# Patient Record
Sex: Female | Born: 1935 | Race: Black or African American | Hispanic: No | Smoking: Never smoker
Health system: Southern US, Community
[De-identification: ages and names within clinical notes are randomized; demographics above are authoritative.]

## PROBLEM LIST (undated history)

## (undated) DIAGNOSIS — F0390 Unspecified dementia without behavioral disturbance: Secondary | ICD-10-CM

## (undated) DIAGNOSIS — N19 Unspecified kidney failure: Secondary | ICD-10-CM

---

## 2021-03-12 ENCOUNTER — Emergency Department (HOSPITAL_COMMUNITY): Payer: Medicare Other

## 2021-03-12 ENCOUNTER — Other Ambulatory Visit: Payer: Self-pay

## 2021-03-12 ENCOUNTER — Encounter (HOSPITAL_COMMUNITY): Payer: Self-pay

## 2021-03-12 ENCOUNTER — Inpatient Hospital Stay (HOSPITAL_COMMUNITY)
Admission: EM | Admit: 2021-03-12 | Discharge: 2021-03-19 | DRG: 481 | Disposition: A | Discharge status: Skilled Nursing Facility | Payer: Medicare Other | Attending: Internal Medicine | Admitting: Internal Medicine

## 2021-03-12 DIAGNOSIS — Z7982 Long term (current) use of aspirin: Secondary | ICD-10-CM | POA: Diagnosis not present

## 2021-03-12 DIAGNOSIS — S72401K Unspecified fracture of lower end of right femur, subsequent encounter for closed fracture with nonunion: Secondary | ICD-10-CM | POA: Diagnosis present

## 2021-03-12 DIAGNOSIS — Z79899 Other long term (current) drug therapy: Secondary | ICD-10-CM | POA: Diagnosis not present

## 2021-03-12 DIAGNOSIS — F0390 Unspecified dementia without behavioral disturbance: Secondary | ICD-10-CM | POA: Diagnosis present

## 2021-03-12 DIAGNOSIS — W109XXA Fall (on) (from) unspecified stairs and steps, initial encounter: Secondary | ICD-10-CM | POA: Diagnosis present

## 2021-03-12 DIAGNOSIS — I129 Hypertensive chronic kidney disease with stage 1 through stage 4 chronic kidney disease, or unspecified chronic kidney disease: Secondary | ICD-10-CM | POA: Diagnosis present

## 2021-03-12 DIAGNOSIS — N1831 Chronic kidney disease, stage 3a: Secondary | ICD-10-CM | POA: Diagnosis present

## 2021-03-12 DIAGNOSIS — S72141A Displaced intertrochanteric fracture of right femur, initial encounter for closed fracture: Secondary | ICD-10-CM | POA: Diagnosis not present

## 2021-03-12 DIAGNOSIS — E039 Hypothyroidism, unspecified: Secondary | ICD-10-CM | POA: Diagnosis present

## 2021-03-12 DIAGNOSIS — W19XXXA Unspecified fall, initial encounter: Secondary | ICD-10-CM

## 2021-03-12 DIAGNOSIS — R9431 Abnormal electrocardiogram [ECG] [EKG]: Secondary | ICD-10-CM | POA: Diagnosis not present

## 2021-03-12 DIAGNOSIS — E871 Hypo-osmolality and hyponatremia: Secondary | ICD-10-CM | POA: Diagnosis not present

## 2021-03-12 DIAGNOSIS — S72143A Displaced intertrochanteric fracture of unspecified femur, initial encounter for closed fracture: Secondary | ICD-10-CM

## 2021-03-12 DIAGNOSIS — D62 Acute posthemorrhagic anemia: Secondary | ICD-10-CM | POA: Diagnosis not present

## 2021-03-12 DIAGNOSIS — Z20822 Contact with and (suspected) exposure to covid-19: Secondary | ICD-10-CM | POA: Diagnosis not present

## 2021-03-12 DIAGNOSIS — S72001A Fracture of unspecified part of neck of right femur, initial encounter for closed fracture: Secondary | ICD-10-CM | POA: Diagnosis not present

## 2021-03-12 DIAGNOSIS — Z79890 Hormone replacement therapy (postmenopausal): Secondary | ICD-10-CM

## 2021-03-12 DIAGNOSIS — Y9301 Activity, walking, marching and hiking: Secondary | ICD-10-CM | POA: Diagnosis present

## 2021-03-12 DIAGNOSIS — R339 Retention of urine, unspecified: Secondary | ICD-10-CM | POA: Diagnosis not present

## 2021-03-12 DIAGNOSIS — S7291XA Unspecified fracture of right femur, initial encounter for closed fracture: Secondary | ICD-10-CM | POA: Diagnosis not present

## 2021-03-12 HISTORY — DX: Unspecified kidney failure: N19

## 2021-03-12 HISTORY — DX: Unspecified dementia, unspecified severity, without behavioral disturbance, psychotic disturbance, mood disturbance, and anxiety: F03.90

## 2021-03-12 LAB — COMPREHENSIVE METABOLIC PANEL
ALT: 19 U/L (ref 0–44)
AST: 26 U/L (ref 15–41)
Albumin: 3.2 g/dL — ABNORMAL LOW (ref 3.5–5.0)
Alkaline Phosphatase: 72 U/L (ref 38–126)
Anion gap: 8 (ref 5–15)
BUN: 14 mg/dL (ref 8–23)
CO2: 26 mmol/L (ref 22–32)
Calcium: 8.8 mg/dL — ABNORMAL LOW (ref 8.9–10.3)
Chloride: 90 mmol/L — ABNORMAL LOW (ref 98–111)
Creatinine, Ser: 1.11 mg/dL — ABNORMAL HIGH (ref 0.44–1.00)
GFR, Estimated: 49 mL/min — ABNORMAL LOW (ref >60)
Glucose, Bld: 168 mg/dL — ABNORMAL HIGH (ref 70–99)
Potassium: 4.3 mmol/L (ref 3.5–5.1)
Sodium: 124 mmol/L — ABNORMAL LOW (ref 135–145)
Total Bilirubin: 0.8 mg/dL (ref 0.3–1.2)
Total Protein: 6.4 g/dL — ABNORMAL LOW (ref 6.5–8.1)

## 2021-03-12 LAB — CBC WITH DIFFERENTIAL/PLATELET
Abs Immature Granulocytes: 0.03 10*3/uL (ref 0.00–0.07)
Basophils Absolute: 0 10*3/uL (ref 0.0–0.1)
Basophils Relative: 0 %
Eosinophils Absolute: 0.1 10*3/uL (ref 0.0–0.5)
Eosinophils Relative: 2 %
HCT: 33.5 % — ABNORMAL LOW (ref 36.0–46.0)
Hemoglobin: 10.9 g/dL — ABNORMAL LOW (ref 12.0–15.0)
Immature Granulocytes: 1 %
Lymphocytes Relative: 15 %
Lymphs Abs: 1 10*3/uL (ref 0.7–4.0)
MCH: 30.2 pg (ref 26.0–34.0)
MCHC: 32.5 g/dL (ref 30.0–36.0)
MCV: 92.8 fL (ref 80.0–100.0)
Monocytes Absolute: 0.7 10*3/uL (ref 0.1–1.0)
Monocytes Relative: 10 %
Neutro Abs: 4.7 10*3/uL (ref 1.7–7.7)
Neutrophils Relative %: 72 %
Platelets: 193 10*3/uL (ref 150–400)
RBC: 3.61 MIL/uL — ABNORMAL LOW (ref 3.87–5.11)
RDW: 12.3 % (ref 11.5–15.5)
WBC: 6.5 10*3/uL (ref 4.0–10.5)
nRBC: 0 % (ref 0.0–0.2)

## 2021-03-12 LAB — RESP PANEL BY RT-PCR (FLU A&B, COVID) ARPGX2
Influenza A by PCR: NEGATIVE
Influenza B by PCR: NEGATIVE
SARS Coronavirus 2 by RT PCR: NEGATIVE

## 2021-03-12 LAB — MAGNESIUM: Magnesium: 1.6 mg/dL — ABNORMAL LOW (ref 1.7–2.4)

## 2021-03-12 MED ORDER — DORZOLAMIDE HCL-TIMOLOL MAL 2-0.5 % OP SOLN
1.0000 [drp] | Freq: Two times a day (BID) | OPHTHALMIC | Status: DC
  Administered 2021-03-12 – 2021-03-19 (×12): 1 [drp] via OPHTHALMIC
  Filled 2021-03-12 (×4): qty 10

## 2021-03-12 MED ORDER — ACETAMINOPHEN 325 MG PO TABS
650.0000 mg | ORAL_TABLET | Freq: Four times a day (QID) | ORAL | Status: DC | PRN
  Administered 2021-03-17: 650 mg via ORAL
  Filled 2021-03-12: qty 2

## 2021-03-12 MED ORDER — LEVOTHYROXINE SODIUM 112 MCG PO TABS
112.0000 ug | ORAL_TABLET | Freq: Every day | ORAL | Status: DC
  Administered 2021-03-13 – 2021-03-18 (×4): 112 ug via ORAL
  Filled 2021-03-12 (×7): qty 1

## 2021-03-12 MED ORDER — LEVOTHYROXINE SODIUM 112 MCG PO TABS: 112.0000 ug | ORAL_TABLET | Freq: Every day | ORAL | Status: DC

## 2021-03-12 MED ORDER — POLYETHYLENE GLYCOL 3350 17 G PO PACK: 17.0000 g | PACK | Freq: Every day | ORAL | Status: DC | PRN

## 2021-03-12 MED ORDER — PROMETHAZINE HCL 12.5 MG PO TABS: 12.5000 mg | ORAL_TABLET | Freq: Four times a day (QID) | ORAL | Status: DC | PRN

## 2021-03-12 MED ORDER — DONEPEZIL HCL 5 MG PO TABS
10.0000 mg | ORAL_TABLET | Freq: Every day | ORAL | Status: DC
  Administered 2021-03-12 – 2021-03-17 (×7): 10 mg via ORAL
  Filled 2021-03-12 (×8): qty 2

## 2021-03-12 MED ORDER — SODIUM CHLORIDE 0.9 % IV SOLN: INTRAVENOUS | Status: AC

## 2021-03-12 MED ORDER — LEVETIRACETAM 500 MG PO TABS
500.0000 mg | ORAL_TABLET | Freq: Every day | ORAL | Status: DC
  Administered 2021-03-13 – 2021-03-18 (×6): 500 mg via ORAL
  Filled 2021-03-12 (×7): qty 1

## 2021-03-12 MED ORDER — CARVEDILOL 12.5 MG PO TABS
25.0000 mg | ORAL_TABLET | Freq: Every day | ORAL | Status: DC
  Administered 2021-03-12 – 2021-03-19 (×8): 25 mg via ORAL
  Filled 2021-03-12 (×9): qty 2

## 2021-03-12 MED ORDER — LATANOPROST 0.005 % OP SOLN
1.0000 [drp] | Freq: Every day | OPHTHALMIC | Status: DC
  Administered 2021-03-12 – 2021-03-18 (×6): 1 [drp] via OPHTHALMIC
  Filled 2021-03-12 (×3): qty 2.5

## 2021-03-12 MED ORDER — FENTANYL CITRATE (PF) 100 MCG/2ML IJ SOLN
50.0000 ug | Freq: Once | INTRAMUSCULAR | Status: AC
Start: 2021-03-12 — End: 2021-03-12
  Administered 2021-03-12: 50 ug via INTRAVENOUS
  Filled 2021-03-12: qty 2

## 2021-03-12 MED ORDER — FAMOTIDINE 20 MG PO TABS
20.0000 mg | ORAL_TABLET | Freq: Every day | ORAL | Status: DC
  Administered 2021-03-12 – 2021-03-17 (×6): 20 mg via ORAL
  Filled 2021-03-12 (×7): qty 1

## 2021-03-12 MED ORDER — CEFAZOLIN SODIUM-DEXTROSE 2-4 GM/100ML-% IV SOLN
2.0000 g | INTRAVENOUS | Status: AC
  Filled 2021-03-12: qty 100

## 2021-03-12 MED ORDER — MORPHINE SULFATE (PF) 2 MG/ML IV SOLN
2.0000 mg | INTRAVENOUS | Status: DC | PRN
  Administered 2021-03-14 – 2021-03-19 (×4): 2 mg via INTRAVENOUS
  Filled 2021-03-12 (×5): qty 1

## 2021-03-12 MED ORDER — MEMANTINE HCL 10 MG PO TABS
5.0000 mg | ORAL_TABLET | Freq: Every day | ORAL | Status: DC
  Administered 2021-03-13 – 2021-03-19 (×7): 5 mg via ORAL
  Filled 2021-03-12 (×7): qty 1

## 2021-03-12 MED ORDER — TRANEXAMIC ACID-NACL 1000-0.7 MG/100ML-% IV SOLN
1000.0000 mg | INTRAVENOUS | Status: AC
  Filled 2021-03-12: qty 100

## 2021-03-12 MED ORDER — ACETAMINOPHEN 650 MG RE SUPP: 650.0000 mg | Freq: Four times a day (QID) | RECTAL | Status: DC | PRN

## 2021-03-12 MED ORDER — ESCITALOPRAM OXALATE 10 MG PO TABS: 20.0000 mg | ORAL_TABLET | Freq: Every day | ORAL | Status: DC

## 2021-03-12 MED ORDER — ENALAPRIL MALEATE 5 MG PO TABS
2.5000 mg | ORAL_TABLET | Freq: Every day | ORAL | Status: DC
  Administered 2021-03-13 – 2021-03-18 (×6): 2.5 mg via ORAL
  Filled 2021-03-12: qty 1
  Filled 2021-03-12: qty 0.5
  Filled 2021-03-12 (×3): qty 1
  Filled 2021-03-12 (×3): qty 0.5
  Filled 2021-03-12 (×2): qty 1

## 2021-03-12 NOTE — ED Notes (Signed)
Pt changed x2 staff members. purwick placed at this time

## 2021-03-12 NOTE — ED Notes (Signed)
Pt is confused and gets agitated easily when staff is trying to give care. Pt doesn't understand all requests and why.

## 2021-03-12 NOTE — ED Triage Notes (Signed)
Pt arrived [facility] from pt home. Pt was walking down two steps and fell on her right hip. Pt has dementia and has a sitter that stay with her during the day.

## 2021-03-12 NOTE — ED Notes (Signed)
Pa at bedside during triage 

## 2021-03-12 NOTE — ED Provider Notes (Addendum)
[facility] EMERGENCY DEPARTMENT Provider Note   CSN: [id number] Arrival date & time: 03/12/21  1356     History Chief Complaint  Patient presents with   Hip Pain    right    Latoya Perry is a 87 y.o. female with past medical history significant for CKD, hypertension, dementia who presents for evaluation of fall.  Witnessed fall with family member.  She is normally able to walk with gait belt with assistance.  Fell directly onto her right hip.  Unsure if hit head.  No anticoagulation.  Level 5 caveat-dementia  Patient not able to provide any history  Sister in room states patient is at baseline mentation.  No illnesses prior to this.  HPI     Past Medical History:  Diagnosis Date   Dementia [facility])    Renal failure     Patient Active Problem List   Diagnosis Date Noted   Closed right hip fracture (HCC) 03/12/2021    History reviewed. No pertinent surgical history.   OB History   No obstetric history on file.     No family history on file.     Home Medications Prior to Admission medications   Medication Sig Start Date End Date Taking? Authorizing Provider  aspirin 81 MG EC tablet Take by mouth.   Yes [provider]  atorvastatin (LIPITOR) 20 MG tablet Take by mouth.   Yes [provider]  azelastine (ASTELIN) 0.1 % nasal spray Place into the nose. 08/08/20  Yes [provider]  calcium carbonate (OS-CAL - DOSED IN MG OF ELEMENTAL CALCIUM) 1250 (500 Ca) MG tablet Take by mouth.   Yes [provider]  carvedilol (COREG) 25 MG tablet Take 25 mg by mouth daily.   Yes [provider]  chlorthalidone (HYGROTON) 25 MG tablet Take 1 tablet by mouth every morning.   Yes [provider]  donepezil (ARICEPT) 10 MG tablet Take 1 tablet by mouth at bedtime. 07/26/15  Yes [provider]  dorzolamide-timolol (COSOPT) 22.3-6.8 MG/ML ophthalmic solution Administer 1 drop into both eyes daily 07/24/20  Yes  [provider]  enalapril (VASOTEC) 2.5 MG tablet Take 2.5 mg by mouth daily. 03/04/16  Yes [provider]  escitalopram (LEXAPRO) 20 MG tablet Take by mouth.   Yes [provider]  famotidine (PEPCID) 20 MG tablet Take 1 tablet by mouth at bedtime. 07/02/20  Yes [provider]  ferrous sulfate 325 (65 FE) MG EC tablet Take by mouth.   Yes [provider]  latanoprost (XALATAN) 0.005 % ophthalmic solution Place 1 drop into both eyes at bedtime.   Yes [provider]  levETIRAcetam (KEPPRA) 500 MG tablet Take 500 mg by mouth daily. 10/11/14  Yes [provider]  levothyroxine (SYNTHROID) 112 MCG tablet Take 112 mcg by mouth daily before breakfast.   Yes [provider]  memantine (NAMENDA) 5 MG tablet Take 5 mg by mouth daily. 02/14/16  Yes [provider]    Allergies    Patient has no allergy information on record.  Review of Systems   Review of Systems  Constitutional: Negative.   HENT: Negative.    Respiratory: Negative.    Cardiovascular: Negative.   Gastrointestinal: Negative.   Genitourinary: Negative.   Musculoskeletal:        Right hip pain  Skin: Negative.   Neurological: Negative.   All other systems reviewed and are negative.  Physical Exam Updated Vital Signs BP (!) 165/76   Pulse 62  Temp 98.2 F (36.8 C) (Axillary)   Resp (!) 24   Ht 5\' 9"  (1.753 m)   Wt 67.6 kg   SpO2 100%   BMI 22.00 kg/m   Physical Exam Vitals and nursing note reviewed.  Constitutional:      General: She is not in acute distress.    Appearance: She is well-developed. She is not ill-appearing, toxic-appearing or diaphoretic.  HENT:     Head: Atraumatic.     Nose: Nose normal.     Mouth/Throat:     Mouth: Mucous membranes are moist.  Eyes:     Pupils: Pupils are equal, round, and reactive to light.  Cardiovascular:     Rate and Rhythm: Normal rate.     Pulses: Normal pulses.          Radial pulses  are 2+ on the right side and 2+ on the left side.       Dorsalis pedis pulses are 2+ on the right side and 2+ on the left side.     Heart sounds: Normal heart sounds.  Pulmonary:     Effort: Pulmonary effort is normal. No respiratory distress.     Breath sounds: Normal breath sounds and air entry.     Comments: Clear bilaterally Chest:     Comments: Equal rise and fall to chest wall.  Nontender Abdominal:     General: Bowel sounds are normal. There is no distension.     Palpations: Abdomen is soft.     Tenderness: There is no abdominal tenderness. There is no right CVA tenderness or left CVA tenderness.     Comments: Soft, nontender  Musculoskeletal:        General: Normal range of motion.     Cervical back: Normal range of motion.     Comments: Lifts bilateral arms above head, diffuse tenderness to left lower extremity, mild shortening and external rotation of leg. Wiggles toes without difficulty  Skin:    General: Skin is warm and dry.     Capillary Refill: Capillary refill takes less than 2 seconds.     Comments: No lacerations  Neurological:     General: No focal deficit present.     Mental Status: She is alert. Mental status is at baseline. She is confused.     Cranial Nerves: Cranial nerves are intact.     Sensory: Sensation is intact.     Motor: Motor function is intact.     Coordination: Coordination is intact.     Comments: Intermittently agitated, does not follow most commands. No facial droop. At baseline mentation per sister in room  Psychiatric:        Mood and Affect: Mood normal.    ED Results / Procedures / Treatments   Labs (all labs ordered are listed, but only abnormal results are displayed) Labs Reviewed  CBC WITH DIFFERENTIAL/PLATELET - Abnormal; Notable for the following components:      Result Value   RBC 3.61 (*)    Hemoglobin 10.9 (*)    HCT 33.5 (*)    All other components within normal limits  COMPREHENSIVE METABOLIC PANEL - Abnormal; Notable for  the following components:   Sodium 124 (*)    Chloride 90 (*)    Glucose, Bld 168 (*)    Creatinine, Ser 1.11 (*)    Calcium 8.8 (*)    Total Protein 6.4 (*)    Albumin 3.2 (*)    GFR, Estimated 49 (*)    All other  components within normal limits  RESP PANEL BY RT-PCR (FLU A&B, COVID) [id number]    EKG None  Radiology DG Chest 1 View  Result Date: 03/12/2021 CLINICAL DATA:  Right hip fracture. EXAM: CHEST  1 VIEW COMPARISON:  None. FINDINGS: The heart size and mediastinal contours are within normal limits. Both lungs are clear. The visualized skeletal structures are unremarkable. IMPRESSION: No active disease. Aortic Atherosclerosis (ICD10-I70.0). Electronically Signed   By: Lupita RaiderJames  Green Jr M.D.   On: 03/12/2021 17:15   DG Tibia/Fibula Right  Result Date: 03/12/2021 CLINICAL DATA:  Right leg pain after fall today. EXAM: RIGHT TIBIA AND FIBULA - 2 VIEW COMPARISON:  None. FINDINGS: There is no evidence of fracture or other focal bone lesions. Soft tissues are unremarkable. IMPRESSION: Negative. Electronically Signed   By: Lupita RaiderJames  Green Jr M.D.   On: 03/12/2021 17:14   CT Head Wo Contrast  Result Date: 03/12/2021 CLINICAL DATA:  Head trauma, moderate to severe.  Fall down steps EXAM: CT HEAD WITHOUT CONTRAST TECHNIQUE: Contiguous axial images were obtained from the base of the skull through the vertex without intravenous contrast. COMPARISON:  None. FINDINGS: Brain: There is atrophy and chronic small vessel disease changes. No acute intracranial abnormality. Specifically, no hemorrhage, hydrocephalus, mass lesion, acute infarction, or significant intracranial injury. Vascular: No hyperdense vessel or unexpected calcification. Skull: No acute calvarial abnormality. Sinuses/Orbits: Visualized paranasal sinuses and mastoids clear. Orbital soft tissues unremarkable. Other: None IMPRESSION: Atrophy, chronic microvascular disease. No acute intracranial abnormality. Electronically Signed   By: Charlett NoseKevin   Dover M.D.   On: 03/12/2021 16:10   CT Cervical Spine Wo Contrast  Result Date: 03/12/2021 CLINICAL DATA:  Fall down steps.  Neck trauma. EXAM: CT CERVICAL SPINE WITHOUT CONTRAST TECHNIQUE: Multidetector CT imaging of the cervical spine was performed without intravenous contrast. Multiplanar CT image reconstructions were also generated. COMPARISON:  None. FINDINGS: Alignment: No subluxation. Skull base and vertebrae: No acute fracture. No primary bone lesion or focal pathologic process. Soft tissues and spinal canal: No prevertebral fluid or swelling. No visible canal hematoma. Disc levels: Diffuse degenerative disc disease with anterior spurring. Moderate bilateral diffuse degenerative facet disease. Upper chest: No acute findings Other: None IMPRESSION: Degenerative disc and facet disease. No acute bony abnormality. Electronically Signed   By: Charlett NoseKevin  Dover M.D.   On: 03/12/2021 16:11   DG Femur 1 View Right  Result Date: 03/12/2021 CLINICAL DATA:  Right hip pain after fall today. EXAM: RIGHT FEMUR 1 VIEW COMPARISON:  None. FINDINGS: Moderately comminuted and displaced fracture is seen involving the intertrochanteric region of the proximal right femur. The middle and distal aspects of the femur are unremarkable. IMPRESSION: Moderately comminuted and displaced intertrochanteric fracture of proximal right femur. Electronically Signed   By: Lupita RaiderJames  Green Jr M.D.   On: 03/12/2021 17:13   DG HIPS BILAT WITH PELVIS MIN 5 VIEWS  Result Date: 03/12/2021 CLINICAL DATA:  Fall today, leg and hip pain. EXAM: DG HIP (WITH OR WITHOUT PELVIS) 5+V BILAT COMPARISON:  Femur evaluation of the same date. FINDINGS: Comminuted intratrochanteric fracture of the RIGHT proximal femur with varus angulation and moderate displacement of the lesser trochanter. Femoral head is located within the acetabulum. There is also apex anterior angulation seen on the cross-table lateral. LEFT hip is located without signs of fracture. There are  signs of vascular disease. Bony pelvis without signs of fracture. IMPRESSION: Comminuted, angulated intertrochanteric fracture of the proximal RIGHT femur with varus angulation and moderate displacement of the lesser trochanter. Electronically Signed  By: Donzetta Kohut M.D.   On: 03/12/2021 17:16    Procedures Procedures   Medications Ordered in ED Medications  ceFAZolin (ANCEF) IVPB 2g/100 mL premix (has no administration in time range)  tranexamic acid (CYKLOKAPRON) IVPB 1,000 mg (has no administration in time range)  fentaNYL (SUBLIMAZE) injection 50 mcg (50 mcg Intravenous Given 03/12/21 1520)    ED Course  I have reviewed the triage vital signs and the nursing notes.  Pertinent labs & imaging results that were available during my care of the patient were reviewed by me and considered in my medical decision making (see chart for details).  87 year old here for evaluation of witnessed mechanical fall onto her right hip just PTA.  Patient has dementia at baseline, does not follow most commands and cannot provide history.  Diffuse tenderness to right hip with shortening and external rotation right lower extremity.  No other obvious traumatic injuries, no lacerations, no head injuries.  Will obtain labs, imaging and reassess.  High suspicion for right hip fracture  Labs and imaging personally reviewed and interpreted:  CBC without leukocytosis Metabolic panel with sodium at 993, glucose 168, corrected sodium to 126, baseline 129 per prior Nephrology labs COVID, Flu neg CT head without acute abnormality Ct cervical without acute abnormality DG hips with right comminuted, angulated intertroc fx DG chest without acute abnormality DG right tibfib without acute abnormality Dg femur with hip fx  Patient reassessed. Sister in room. Patient more cooperative at this time. Discussed labs and imaging. Patient not currently followed by Ortho outpatient. Will admit to medicine   Clinical Course  as of 03/12/21 1831  Tue Mar 12, 2021  1739 CONSULT Ortho Dr. Dallas Schimke, NPO after midnight will follow along [BH]    Clinical Course User Index [BH] Dana Debo A, PA-C    CONSULT with Dr. Mariea Clonts with [facility] who will evaluate patient for admission.  The patient appears reasonably stabilized for admission considering the current resources, flow, and capabilities available in the ED at this time, and I doubt any other [facility] requiring further screening and/or treatment in the ED prior to admission.  MDM Rules/Calculators/A&P                            Final Clinical Impression(s) / ED Diagnoses Final diagnoses:  Fall  Fall, initial encounter  Closed fracture of right hip, initial encounter (HCC)  Dementia without behavioral disturbance, unspecified dementia type [facility])    Rx / DC Orders ED Discharge Orders     None        Takari Duncombe A, PA-C 03/12/21 1830    Avier Jech A, PA-C 03/12/21 1831    Blane Ohara, MD 03/14/21 1725

## 2021-03-12 NOTE — ED Notes (Signed)
Pt family verbalized pt needs smashed foods because she cant swallow good.

## 2021-03-12 NOTE — H&P (Addendum)
History and Physical    Latoya DolphinMattie Perry NFA:[id number] DOB: 1933/12/15 DOA: 03/12/2021  PCP: Smith RobertKikel, Stephen, MD   Patient coming from: Home  I have personally briefly [facility]  Chief Complaint: Fall  HPI: Latoya DolphinMattie Perry is a 87 y.o. female with medical history significant for dementia.  Patient was brought to the ED via EMS reports of a fall, patient was walking down 2 steps and fell on her right hip.  History is obtained from chart review, patient has significant dementia limiting history. Patient has a sitter that sits with her during the day.  Patient is normally able to walk with assistance with a gait belt. I talked to patient's sister [name], patient fell because she missed the second step while coming down the stairs.  Patient's dementia is advanced and she is not able to communicate, assistance with all ADLs.   ED Course: Stable Vitals.  Sodium 124.  Head CT unremarkable.  Pelvic x-ray shows comminuted angulated intertrochanteric fracture of the proximal right femur. EDP talked to Dr. Dallas Schimkeairns, will see in the morning n.p.o. midnight.  Review of Systems: Unable to assess due to advanced dementia.  Past Medical History:  Diagnosis Date   Dementia (HCC)    Renal failure     History reviewed. No pertinent surgical history.   has no history on file for tobacco use, alcohol use, and drug use.  Not on File  Unable to ascertain family history due to dementia.  Prior to Admission medications   Medication Sig Start Date End Date Taking? Authorizing Provider  aspirin 81 MG EC tablet Take by mouth.   Yes [provider]  atorvastatin (LIPITOR) 20 MG tablet Take by mouth.   Yes [provider]  azelastine (ASTELIN) 0.1 % nasal spray Place into the nose. 08/08/20  Yes [provider]  calcium carbonate (OS-CAL - DOSED IN MG OF ELEMENTAL CALCIUM) 1250 (500 Ca) MG tablet Take by mouth.   Yes [provider]  carvedilol (COREG) 25 MG tablet Take 25 mg by mouth daily.   Yes [provider]  chlorthalidone (HYGROTON) 25 MG tablet Take 1 tablet by mouth every morning.   Yes [provider]  donepezil (ARICEPT) 10 MG tablet Take 1 tablet by mouth at bedtime. 07/26/15  Yes [provider]  dorzolamide-timolol (COSOPT) 22.3-6.8 MG/ML ophthalmic solution Administer 1 drop into both eyes daily 07/24/20  Yes [provider]  enalapril (VASOTEC) 2.5 MG tablet Take 2.5 mg by mouth daily. 03/04/16  Yes [provider]  escitalopram (LEXAPRO) 20 MG tablet Take by mouth.   Yes [provider]  famotidine (PEPCID) 20 MG tablet Take 1 tablet by mouth at bedtime. 07/02/20  Yes [provider]  ferrous sulfate 325 (65 FE) MG EC tablet Take by mouth.   Yes [provider]  latanoprost (XALATAN) 0.005 % ophthalmic solution Place 1 drop into both eyes at bedtime.   Yes [provider]  levETIRAcetam (KEPPRA) 500 MG tablet Take 500 mg by mouth daily. 10/11/14  Yes [provider]  levothyroxine (SYNTHROID) 112 MCG tablet Take 112 mcg by mouth daily before breakfast.   Yes [provider]  memantine (NAMENDA) 5 MG tablet Take 5 mg by mouth daily. 02/14/16  Yes [provider]    Physical Exam: Exam limited by patient's baseline dementia.  Not following directions.  Vitals:   03/12/21 1425 03/12/21 1430 03/12/21 1500 03/12/21 1700  BP: (!) 151/68  106/88 (!) 169/77  Pulse: 60 64 (!) 59 63  Resp: 18 (!) 22 18 (!) 22  Temp:      TempSrc:      SpO2: 99% 98% 100% 100%  Weight:      Height:        Constitutional: NAD, calm, comfortable Vitals:   03/12/21 1425 03/12/21 1430 03/12/21 1500 03/12/21 1700  BP: (!) 151/68  106/88 (!) 169/77  Pulse: 60 64 (!) 59 63  Resp: 18 (!) 22 18 (!) 22  Temp:      TempSrc:      SpO2: 99% 98% 100% 100%  Weight:      Height:       Eyes:  lids and conjunctivae  normal ENMT: Limited exam.  Mucous membranes are moist. Neck: normal, supple, no masses, no thyromegaly Respiratory: clear to auscultation bilaterally, no wheezing, no crackles. Normal respiratory effort. No accessory muscle use.  Cardiovascular: Regular rate and rhythm, no murmurs / rubs / gallops. No extremity edema. 2+ pedal pulses.  Abdomen: no tenderness, no masses palpated. No hepatosplenomegaly. Bowel sounds positive.  Musculoskeletal: no clubbing / cyanosis. No joint deformity upper and lower extremities. Good ROM, no contractures. Normal muscle tone.  Skin: no rashes, lesions, ulcers. No induration Neurologic: No apparent cranial nerve abnormality, muted movement to right lower extremity otherwise moving other extremities spontaneously. Psychiatric: Limited exam.  Baseline dementia..   Labs on Admission: I have personally reviewed following labs and imaging studies  CBC: Recent Labs  Lab 03/12/21 1441  WBC 6.5  NEUTROABS 4.7  HGB 10.9*  HCT 33.5*  MCV 92.8  PLT 193   Basic Metabolic Panel: Recent Labs  Lab 03/12/21 1441  NA 124*  K 4.3  CL 90*  CO2 26  GLUCOSE 168*  BUN 14  CREATININE 1.11*  CALCIUM 8.8*   Liver Function Tests: Recent Labs  Lab 03/12/21 1441  AST 26  ALT 19  ALKPHOS 72  BILITOT 0.8  PROT 6.4*  ALBUMIN 3.2*   Radiological Exams on Admission: DG Chest 1 View  Result Date: 03/12/2021 CLINICAL DATA:  Right hip fracture. EXAM: CHEST  1 VIEW COMPARISON:  None. FINDINGS: The heart size and mediastinal contours are within normal limits. Both lungs are clear. The visualized skeletal structures are unremarkable. IMPRESSION: No active disease. Aortic Atherosclerosis (ICD10-I70.0). Electronically Signed   By: Lupita Raider M.D.   On: 03/12/2021 17:15   DG Tibia/Fibula Right  Result Date: 03/12/2021 CLINICAL DATA:  Right leg pain after fall today. EXAM: RIGHT TIBIA AND FIBULA - 2 VIEW COMPARISON:  None. FINDINGS: There is no evidence of fracture  or other focal bone lesions. Soft tissues are unremarkable. IMPRESSION: Negative. Electronically Signed   By: Lupita Raider M.D.   On: 03/12/2021 17:14   CT Head Wo Contrast  Result Date: 03/12/2021 CLINICAL DATA:  Head trauma, moderate to severe.  Fall down steps EXAM: CT HEAD WITHOUT CONTRAST TECHNIQUE: Contiguous axial images were obtained from the base of the skull through the vertex without intravenous contrast. COMPARISON:  None. FINDINGS: Brain: There is atrophy and chronic small vessel disease changes. No acute intracranial abnormality. Specifically, no hemorrhage, hydrocephalus, mass lesion, acute infarction, or significant intracranial injury. Vascular: No hyperdense vessel or unexpected calcification. Skull: No acute calvarial abnormality. Sinuses/Orbits: Visualized paranasal sinuses and mastoids clear. Orbital soft tissues unremarkable. Other: None IMPRESSION: Atrophy, chronic microvascular disease. No acute intracranial abnormality. Electronically Signed   By: Charlett Nose M.D.   On: 03/12/2021  16:10   CT Cervical Spine Wo Contrast  Result Date: 03/12/2021 CLINICAL DATA:  Fall down steps.  Neck trauma. EXAM: CT CERVICAL SPINE WITHOUT CONTRAST TECHNIQUE: Multidetector CT imaging of the cervical spine was performed without intravenous contrast. Multiplanar CT image reconstructions were also generated. COMPARISON:  None. FINDINGS: Alignment: No subluxation. Skull base and vertebrae: No acute fracture. No primary bone lesion or focal pathologic process. Soft tissues and spinal canal: No prevertebral fluid or swelling. No visible canal hematoma. Disc levels: Diffuse degenerative disc disease with anterior spurring. Moderate bilateral diffuse degenerative facet disease. Upper chest: No acute findings Other: None IMPRESSION: Degenerative disc and facet disease. No acute bony abnormality. Electronically Signed   By: Charlett Nose M.D.   On: 03/12/2021 16:11   DG Femur 1 View Right  Result Date:  03/12/2021 CLINICAL DATA:  Right hip pain after fall today. EXAM: RIGHT FEMUR 1 VIEW COMPARISON:  None. FINDINGS: Moderately comminuted and displaced fracture is seen involving the intertrochanteric region of the proximal right femur. The middle and distal aspects of the femur are unremarkable. IMPRESSION: Moderately comminuted and displaced intertrochanteric fracture of proximal right femur. Electronically Signed   By: Lupita Raider M.D.   On: 03/12/2021 17:13   DG HIPS BILAT WITH PELVIS MIN 5 VIEWS  Result Date: 03/12/2021 CLINICAL DATA:  Fall today, leg and hip pain. EXAM: DG HIP (WITH OR WITHOUT PELVIS) 5+V BILAT COMPARISON:  Femur evaluation of the same date. FINDINGS: Comminuted intratrochanteric fracture of the RIGHT proximal femur with varus angulation and moderate displacement of the lesser trochanter. Femoral head is located within the acetabulum. There is also apex anterior angulation seen on the cross-table lateral. LEFT hip is located without signs of fracture. There are signs of vascular disease. Bony pelvis without signs of fracture. IMPRESSION: Comminuted, angulated intertrochanteric fracture of the proximal RIGHT femur with varus angulation and moderate displacement of the lesser trochanter. Electronically Signed   By: Donzetta Kohut M.D.   On: 03/12/2021 17:16    EKG: Independently reviewed.  Sinus rhythm, rate 64.  QTc prolonged 508.  No significant ST or T wave abnormalities no prior EKG to compare.  Assessment/Plan Principal Problem:   Closed right hip fracture (HCC) Active Problems:   Dementia (HCC)   Closed right hip fracture 2/2 mechanical fall-transcervical CT unremarkable, pelvic x-ray shows comminuted intratrochanteric fracture of the proximal right femur. - Dr. Dallas Schimke consulted - N. P.o. midnight - IV morphine 2 mg as needed  Hyponatremia-sodium 124, baseline per Care Everywhere over the past year 131.1.  -1 4 1. -Hydrate  Prolonged QTC-508.  No prior EKGs.  Home  medications include escitalopram. -Check Magnesium -Monitor on telemetry  Dementia-advanced.  At baseline needs assistance with ADLs.  Cannot communicate. -Resume donepezil, memantine, Lexapro - mechanical soft diet, pills should be crushed.  HTN -Resume carvedilol, enalapril - Hold HCTZ while hydrating  Hypothyroidism - Resume Synthroid  CKD 3A- cr 1.1,.  Stable.   DVT prophylaxis: SCDS Code Status: Full code, confirmed with sister - [name] Family Communication: talked to sister, Candancy in the phone who is HCPOA.  Disposition Plan: > 2 days Consults called: Ortho Admission status: Inpt, Tele I certify that at the point of admission it is my clinical judgment that the patient will require inpatient hospital care spanning beyond 2 midnights from the point of admission due to high intensity of service, high risk for further deterioration and high frequency of surveillance required.    Onnie Boer MD [facility]  03/12/2021, 9:50 PM

## 2021-03-13 ENCOUNTER — Encounter (HOSPITAL_COMMUNITY): Payer: Self-pay | Admitting: Internal Medicine

## 2021-03-13 ENCOUNTER — Other Ambulatory Visit: Payer: Self-pay

## 2021-03-13 ENCOUNTER — Telehealth: Payer: Self-pay | Admitting: Orthopedic Surgery

## 2021-03-13 DIAGNOSIS — E871 Hypo-osmolality and hyponatremia: Secondary | ICD-10-CM | POA: Diagnosis not present

## 2021-03-13 DIAGNOSIS — W109XXA Fall (on) (from) unspecified stairs and steps, initial encounter: Secondary | ICD-10-CM

## 2021-03-13 DIAGNOSIS — S72001A Fracture of unspecified part of neck of right femur, initial encounter for closed fracture: Secondary | ICD-10-CM | POA: Diagnosis not present

## 2021-03-13 DIAGNOSIS — F0390 Unspecified dementia without behavioral disturbance: Secondary | ICD-10-CM | POA: Diagnosis not present

## 2021-03-13 LAB — BASIC METABOLIC PANEL
Anion gap: 7 (ref 5–15)
Anion gap: 7 (ref 5–15)
BUN: 14 mg/dL (ref 8–23)
BUN: 15 mg/dL (ref 8–23)
CO2: 26 mmol/L (ref 22–32)
CO2: 28 mmol/L (ref 22–32)
Calcium: 8.7 mg/dL — ABNORMAL LOW (ref 8.9–10.3)
Calcium: 8.4 mg/dL — ABNORMAL LOW (ref 8.9–10.3)
Chloride: 92 mmol/L — ABNORMAL LOW (ref 98–111)
Chloride: 88 mmol/L — ABNORMAL LOW (ref 98–111)
Creatinine, Ser: 0.88 mg/dL (ref 0.44–1.00)
Creatinine, Ser: 1.02 mg/dL — ABNORMAL HIGH (ref 0.44–1.00)
GFR, Estimated: >60 mL/min (ref >60)
GFR, Estimated: 54 mL/min — ABNORMAL LOW (ref >60)
Glucose, Bld: 192 mg/dL — ABNORMAL HIGH (ref 70–99)
Glucose, Bld: 231 mg/dL — ABNORMAL HIGH (ref 70–99)
Potassium: 3.9 mmol/L (ref 3.5–5.1)
Potassium: 3.8 mmol/L (ref 3.5–5.1)
Sodium: 125 mmol/L — ABNORMAL LOW (ref 135–145)
Sodium: 123 mmol/L — ABNORMAL LOW (ref 135–145)

## 2021-03-13 LAB — OSMOLALITY: Osmolality: 274 mOsm/kg — ABNORMAL LOW (ref 275–295)

## 2021-03-13 LAB — CBC
HCT: 31 % — ABNORMAL LOW (ref 36.0–46.0)
Hemoglobin: 10.2 g/dL — ABNORMAL LOW (ref 12.0–15.0)
MCH: 30.2 pg (ref 26.0–34.0)
MCHC: 32.9 g/dL (ref 30.0–36.0)
MCV: 91.7 fL (ref 80.0–100.0)
Platelets: 203 10*3/uL (ref 150–400)
RBC: 3.38 MIL/uL — ABNORMAL LOW (ref 3.87–5.11)
RDW: 12.2 % (ref 11.5–15.5)
WBC: 7.3 10*3/uL (ref 4.0–10.5)
nRBC: 0 % (ref 0.0–0.2)

## 2021-03-13 LAB — TSH: TSH: 1.026 u[IU]/mL (ref 0.350–4.500)

## 2021-03-13 LAB — CORTISOL: Cortisol, Plasma: 30.5 ug/dL

## 2021-03-13 NOTE — Progress Notes (Signed)
Attempted again to call sister to get consent on patient.  No answer at this time.  Will attempt to try again.

## 2021-03-13 NOTE — Progress Notes (Signed)
Romeo Apple From [facility] homehealth wanted an update on pt. She requested information on pt when discharge plans are made.  Work phone number: [phone] Cell phone number: [phone]

## 2021-03-13 NOTE — Progress Notes (Signed)
Attempted to call sister to receive Surgery consent, sister did not answer.

## 2021-03-13 NOTE — ED Notes (Signed)
Pt synthroid marked as given however it was held due to NPO for surgery.

## 2021-03-13 NOTE — Progress Notes (Signed)
PROGRESS NOTE  Latoya Perry LHT:[id number] DOB: September 08, 1933 DOA: 03/12/2021 PCP: Smith Robert, MD   LOS: 1 day   Brief narrative: Latoya Perry is a 87 y.o. female with medical history significant for dementia, presented to hospital after sustaining a fall.  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  Patient has advanced dementia and is unable to communicate accurately.  Needs assistance with all ADLs.  In the ED patient was noted to have sodium of 124.  Head CT scan was unremarkable.  X-ray of the pelvis showed comminuted angulated intertrochanteric fracture of the proximal right femur.  Orthopedics was consulted and patient was admitted to hospital.  Assessment/Plan:  Principal Problem:   Closed right hip fracture (HCC) Active Problems:   Dementia (HCC)   Closed fracture of distal end of right femur with nonunion, unspecified fracture morphology, subsequent encounter   Closed right hip fracture 2/2 mechanical fall- Cervical spine CT unremarkable, pelvic x-ray shows comminuted intratrochanteric fracture of the proximal right femur.  Orthopedics on board.  Continue supportive care including pain management.  Patient will need hip surgery but has low sodium levels.  Communicated with orthopedic team.  Hyponatremia-sodium 124, unsure chronicity but Baseline sodium of 131 or more.  Currently getting normal saline hydration.  Was on HCTZ will hold.  Hold Lexapro at this time.  We will check serum osmolality, urine as well as urine sodium cortisol and TSH.  Check BMP in p.m.  Prolonged QTC-508.  No prior EKGs.  Home medications include escitalopram.  Will discontinue Lexapro.  Correct electrolytes.  Telemetry monitor.   Dementia-advanced.  Needing assistance with ADLs.  Continue donepezil, memantine, hold Lexapro for now.Will get speech therapy evaluation.  Dysphagia diet.   Essential HTN Hold HCTZ.  Continue enalapril and Coreg.   Hypothyroidism Continue Synthroid   CKD 3a- cr 1.1,.   Stable.  We will closely monitor.  Check BMP in AM.    DVT prophylaxis: SCDs Start: 03/12/21 2111   Code Status: Full code  Family Communication: Spoke with the patient's family at bedside.  Status is: Inpatient  Remains inpatient appropriate because:Ongoing active pain requiring inpatient pain management, Unsafe d/c plan, IV treatments appropriate due to intensity of illness or inability to take PO, Inpatient level of care appropriate due to severity of illness, and need for surgical intervention, electrolyte imbalance  Dispo: The patient is from: Home              Anticipated d/c is to: SNF              Patient currently is not medically stable to d/c.   Difficult to place patient No   Consultants: Orthopedics  Procedures: None  Anti-infectives:  Preop  Anti-infectives (From admission, onward)    Start     Dose/Rate Route Frequency Ordered Stop   03/13/21 0600  ceFAZolin (ANCEF) IVPB 2g/100 mL premix        2 g 200 mL/hr over 30 Minutes Intravenous On call to O.R. 03/12/21 1808 03/14/21 0559       Subjective: Today, patient was seen and examined at bedside.  Patient's family at bedside.  Denies pain in the hip.  Poor historian.  History of advanced dementia.  Objective: Vitals:   03/13/21 0500 03/13/21 0826  BP: (!) 167/69 (!) 177/74  Pulse:  66  Resp: 17 17  Temp:  98.6 F (37 C)  SpO2: 98% 97%    Intake/Output Summary (Last 24 hours) at 03/13/2021 0844 Last data filed at 03/13/2021 [phone]  Gross per 24 hour  Intake --  Output 300 ml  Net -300 ml   Filed Weights   03/12/21 1420  Weight: 67.6 kg   Body mass index is 22 kg/m.   Physical Exam: GENERAL: Patient is alert awake mildly communicative, disoriented, elderly female, not in obvious distress. HENT: No scleral pallor or icterus. Pupils equally reactive to light. Oral mucosa is moist NECK: is supple, no gross swelling noted. CHEST: Clear to auscultation. No crackles or wheezes.  Diminished  breath sounds bilaterally. CVS: S1 and S2 heard, no murmur. Regular rate and rhythm.  ABDOMEN: Soft, non-tender, bowel sounds are present. EXTREMITIES: No edema.  Right hip externally rotated. CNS: Cranial nerves are intact.  Moves extremities.  Right hip externally rotated SKIN: warm and dry without rashes.  Data Review: I have personally reviewed the following laboratory data and studies,  CBC: Recent Labs  Lab 03/12/21 1441 03/13/21 0531  WBC 6.5 7.3  NEUTROABS 4.7  --   HGB 10.9* 10.2*  HCT 33.5* 31.0*  MCV 92.8 91.7  PLT 193 203   Basic Metabolic Panel: Recent Labs  Lab 03/12/21 1441 03/13/21 0531  NA 124* 125*  K 4.3 3.9  CL 90* 92*  CO2 26 26  GLUCOSE 168* 192*  BUN 14 14  CREATININE 1.11* 0.88  CALCIUM 8.8* 8.7*  MG 1.6*  --    Liver Function Tests: Recent Labs  Lab 03/12/21 1441  AST 26  ALT 19  ALKPHOS 72  BILITOT 0.8  PROT 6.4*  ALBUMIN 3.2*   No results for input(s): LIPASE, AMYLASE in the last 168 hours. No results for input(s): AMMONIA in the last 168 hours. Cardiac Enzymes: No results for input(s): CKTOTAL, CKMB, CKMBINDEX, TROPONINI in the last 168 hours. BNP (last 3 results) No results for input(s): BNP in the last 8760 hours.  ProBNP (last 3 results) No results for input(s): PROBNP in the last 8760 hours.  CBG: No results for input(s): GLUCAP in the last 168 hours. Recent Results (from the past 240 hour(s))  Resp Panel by RT-PCR (Flu A&B, Covid) Nasopharyngeal Swab     Status: None   Collection Time: 03/12/21  2:57 PM   Specimen: Nasopharyngeal Swab; Nasopharyngeal(NP) swabs in vial transport medium  Result Value Ref Range Status   SARS Coronavirus 2 by RT PCR NEGATIVE NEGATIVE Final    Comment: (NOTE) SARS-CoV-2 target nucleic acids are NOT DETECTED.  The SARS-CoV-2 RNA is generally detectable in upper respiratory specimens during the acute phase of infection. The lowest concentration of SARS-CoV-2 viral copies this assay can  detect is 138 copies/mL. A negative result does not preclude SARS-Cov-2 infection and should not be used as the sole basis for treatment or other patient management decisions. A negative result may occur with  improper specimen collection/handling, submission of specimen other than nasopharyngeal swab, presence of viral mutation(s) within the areas targeted by this assay, and inadequate number of viral copies(<138 copies/mL). A negative result must be combined with clinical observations, patient history, and epidemiological information. The expected result is Negative.  Fact Sheet for Patients:  BloggerCourse.com  Fact Sheet for Healthcare Providers:  SeriousBroker.it  This test is no t yet approved or cleared by the [place] FDA and  has been authorized for detection and/or diagnosis of SARS-CoV-2 by FDA under an Emergency Use Authorization (EUA). This EUA will remain  in effect (meaning this test can be used) for the duration of the COVID-19 declaration under Section 564(b)(1) of the Act, 21  U.S.C.section 360bbb-3(b)(1), unless the authorization is terminated  or revoked sooner.       Influenza A by PCR NEGATIVE NEGATIVE Final   Influenza B by PCR NEGATIVE NEGATIVE Final    Comment: (NOTE) The Xpert Xpress SARS-CoV-2/FLU/RSV plus assay is intended as an aid in the diagnosis of influenza from Nasopharyngeal swab specimens and should not be used as a sole basis for treatment. Nasal washings and aspirates are unacceptable for Xpert Xpress SARS-CoV-2/FLU/RSV testing.  Fact Sheet for Patients: BloggerCourse.comhttps://www.fda.gov/media/152166/download  Fact Sheet for Healthcare Providers: SeriousBroker.ithttps://www.fda.gov/media/152162/download  This test is not yet approved or cleared by the [place] FDA and has been authorized for detection and/or diagnosis of SARS-CoV-2 by FDA under an Emergency Use Authorization (EUA). This EUA will remain in  effect (meaning this test can be used) for the duration of the COVID-19 declaration under Section 564(b)(1) of the Act, 21 U.S.C. section 360bbb-3(b)(1), unless the authorization is terminated or revoked.  Performed at [facility], [address] [place], [place] [address]      Studies: DG Chest 1 View  Result Date: 03/12/2021 CLINICAL DATA:  Right hip fracture. EXAM: CHEST  1 VIEW COMPARISON:  None. FINDINGS: The heart size and mediastinal contours are within normal limits. Both lungs are clear. The visualized skeletal structures are unremarkable. IMPRESSION: No active disease. Aortic Atherosclerosis (ICD10-I70.0). Electronically Signed   By: Lupita RaiderJames  Green Jr M.D.   On: 03/12/2021 17:15   DG Tibia/Fibula Right  Result Date: 03/12/2021 CLINICAL DATA:  Right leg pain after fall today. EXAM: RIGHT TIBIA AND FIBULA - 2 VIEW COMPARISON:  None. FINDINGS: There is no evidence of fracture or other focal bone lesions. Soft tissues are unremarkable. IMPRESSION: Negative. Electronically Signed   By: Lupita RaiderJames  Green Jr M.D.   On: 03/12/2021 17:14   CT Head Wo Contrast  Result Date: 03/12/2021 CLINICAL DATA:  Head trauma, moderate to severe.  Fall down steps EXAM: CT HEAD WITHOUT CONTRAST TECHNIQUE: Contiguous axial images were obtained from the base of the skull through the vertex without intravenous contrast. COMPARISON:  None. FINDINGS: Brain: There is atrophy and chronic small vessel disease changes. No acute intracranial abnormality. Specifically, no hemorrhage, hydrocephalus, mass lesion, acute infarction, or significant intracranial injury. Vascular: No hyperdense vessel or unexpected calcification. Skull: No acute calvarial abnormality. Sinuses/Orbits: Visualized paranasal sinuses and mastoids clear. Orbital soft tissues unremarkable. Other: None IMPRESSION: Atrophy, chronic microvascular disease. No acute intracranial abnormality. Electronically Signed   By: Charlett NoseKevin  Dover M.D.   On: 03/12/2021 16:10    CT Cervical Spine Wo Contrast  Result Date: 03/12/2021 CLINICAL DATA:  Fall down steps.  Neck trauma. EXAM: CT CERVICAL SPINE WITHOUT CONTRAST TECHNIQUE: Multidetector CT imaging of the cervical spine was performed without intravenous contrast. Multiplanar CT image reconstructions were also generated. COMPARISON:  None. FINDINGS: Alignment: No subluxation. Skull base and vertebrae: No acute fracture. No primary bone lesion or focal pathologic process. Soft tissues and spinal canal: No prevertebral fluid or swelling. No visible canal hematoma. Disc levels: Diffuse degenerative disc disease with anterior spurring. Moderate bilateral diffuse degenerative facet disease. Upper chest: No acute findings Other: None IMPRESSION: Degenerative disc and facet disease. No acute bony abnormality. Electronically Signed   By: Charlett NoseKevin  Dover M.D.   On: 03/12/2021 16:11   DG Femur 1 View Right  Result Date: 03/12/2021 CLINICAL DATA:  Right hip pain after fall today. EXAM: RIGHT FEMUR 1 VIEW COMPARISON:  None. FINDINGS: Moderately comminuted and displaced fracture is seen involving the intertrochanteric region of  the proximal right femur. The middle and distal aspects of the femur are unremarkable. IMPRESSION: Moderately comminuted and displaced intertrochanteric fracture of proximal right femur. Electronically Signed   By: Lupita Raider M.D.   On: 03/12/2021 17:13   DG HIPS BILAT WITH PELVIS MIN 5 VIEWS  Result Date: 03/12/2021 CLINICAL DATA:  Fall today, leg and hip pain. EXAM: DG HIP (WITH OR WITHOUT PELVIS) 5+V BILAT COMPARISON:  Femur evaluation of the same date. FINDINGS: Comminuted intratrochanteric fracture of the RIGHT proximal femur with varus angulation and moderate displacement of the lesser trochanter. Femoral head is located within the acetabulum. There is also apex anterior angulation seen on the cross-table lateral. LEFT hip is located without signs of fracture. There are signs of vascular disease. Bony  pelvis without signs of fracture. IMPRESSION: Comminuted, angulated intertrochanteric fracture of the proximal RIGHT femur with varus angulation and moderate displacement of the lesser trochanter. Electronically Signed   By: Donzetta Kohut M.D.   On: 03/12/2021 17:16      Joycelyn Das, MD  [facility] 03/13/2021  If 7PM-7AM, please contact night-coverage

## 2021-03-13 NOTE — Progress Notes (Signed)
Pts family at bedside and expressed concern about pt having trouble swallowing and holding food in her mouth. MD notified and pt was made NPO until seen by speech.

## 2021-03-13 NOTE — Consult Note (Signed)
ORTHOPAEDIC CONSULTATION  REQUESTING PHYSICIAN: Pokhrel, Laxman, MD  ASSESSMENT AND PLAN: 87 y.o. female with the following: Right Hip Intertrochanteric femur fracture  This patient requires inpatient admission to the hospitalist, to include preoperative clearance and perioperative medical management  - Weight Bearing Status/Activity: NWB Right lower extremity  - Additional recommended labs/tests: Preop Labs: CBC, BMP, PT/INR, Chest XR, and EKG  -VTE Prophylaxis: Please hold prior to OR; to resume POD#1 at the discretion of the primary team  - Pain control: Recommend PO pain medications PRN; judicious use of narcotics  - Follow-up plan: F/u 10-14 days postop  -Procedures: Plan for OR once patient has been medically optimized  Plan for Right Hip Cephalomedullary nail; NPO since midnight.  Will proceed with surgery on 7/27 as long as she is medically cleared.  Her sodium remains low despite appropriate therapy.       Chief Complaint: Right hip pain  HPI: Latoya Perry is a 87 y.o. female who presented to the ED for evaluation after sustaining a mechanical fall.  Patient is demented and unable to communicate.  Family not available at the bedside.  Most of this information has been gleaned from the chart.  She was walking down stairs yesterday when she missed a step and fell.  As baseline, she is able to walk with assistance.  Not complaining of pain this morning.  No other injuries noted.    Past Medical History:  Diagnosis Date   Dementia ([place])    Renal failure    History reviewed. No pertinent surgical history. Social History   Socioeconomic History   Marital status: Widowed    Spouse name: Not on file   Number of children: Not on file   Years of education: Not on file   Highest education level: Not on file  Occupational History   Not on file  Tobacco Use   Smoking status: Not on file   Smokeless tobacco: Not on file  Substance and Sexual Activity   Alcohol  use: Not on file   Drug use: Not on file   Sexual activity: Not on file  Other Topics Concern   Not on file  Social History Narrative   Not on file   Social Determinants of Health   Financial Resource Strain: Not on file  Food Insecurity: Not on file  Transportation Needs: Not on file  Physical Activity: Not on file  Stress: Not on file  Social Connections: Not on file   No family history on file. Not on File Prior to Admission medications   Medication Sig Start Date End Date Taking? Authorizing Provider  aspirin 81 MG EC tablet Take by mouth.   Yes [provider]  atorvastatin (LIPITOR) 20 MG tablet Take by mouth.   Yes [provider]  azelastine (ASTELIN) 0.1 % nasal spray Place into the nose. 08/08/20  Yes [provider]  calcium carbonate (OS-CAL - DOSED IN MG OF ELEMENTAL CALCIUM) 1250 (500 Ca) MG tablet Take by mouth.   Yes [provider]  carvedilol (COREG) 25 MG tablet Take 25 mg by mouth daily.   Yes [provider]  chlorthalidone (HYGROTON) 25 MG tablet Take 1 tablet by mouth every morning.   Yes [provider]  donepezil (ARICEPT) 10 MG tablet Take 1 tablet by mouth at bedtime. 07/26/15  Yes [provider]  dorzolamide-timolol (COSOPT) 22.3-6.8 MG/ML ophthalmic solution Administer 1 drop into both eyes daily 07/24/20  Yes [provider]  enalapril (VASOTEC) 2.5  MG tablet Take 2.5 mg by mouth daily. 03/04/16  Yes [provider]  escitalopram (LEXAPRO) 20 MG tablet Take by mouth.   Yes [provider]  famotidine (PEPCID) 20 MG tablet Take 1 tablet by mouth at bedtime. 07/02/20  Yes [provider]  ferrous sulfate 325 (65 FE) MG EC tablet Take by mouth.   Yes [provider]  latanoprost (XALATAN) 0.005 % ophthalmic solution Place 1 drop into both eyes at bedtime.   Yes [provider]  levETIRAcetam (KEPPRA) 500 MG tablet Take 500 mg by mouth daily.  10/11/14  Yes [provider]  levothyroxine (SYNTHROID) 112 MCG tablet Take 112 mcg by mouth daily before breakfast.   Yes [provider]  memantine (NAMENDA) 5 MG tablet Take 5 mg by mouth daily. 02/14/16  Yes [provider]   DG Chest 1 View  Result Date: 03/12/2021 CLINICAL DATA:  Right hip fracture. EXAM: CHEST  1 VIEW COMPARISON:  None. FINDINGS: The heart size and mediastinal contours are within normal limits. Both lungs are clear. The visualized skeletal structures are unremarkable. IMPRESSION: No active disease. Aortic Atherosclerosis (ICD10-I70.0). Electronically Signed   By: Marijo Conception M.D.   On: 03/12/2021 17:15   DG Tibia/Fibula Right  Result Date: 03/12/2021 CLINICAL DATA:  Right leg pain after fall today. EXAM: RIGHT TIBIA AND FIBULA - 2 VIEW COMPARISON:  None. FINDINGS: There is no evidence of fracture or other focal bone lesions. Soft tissues are unremarkable. IMPRESSION: Negative. Electronically Signed   By: Marijo Conception M.D.   On: 03/12/2021 17:14   CT Head Wo Contrast  Result Date: 03/12/2021 CLINICAL DATA:  Head trauma, moderate to severe.  Fall down steps EXAM: CT HEAD WITHOUT CONTRAST TECHNIQUE: Contiguous axial images were obtained from the base of the skull through the vertex without intravenous contrast. COMPARISON:  None. FINDINGS: Brain: There is atrophy and chronic small vessel disease changes. No acute intracranial abnormality. Specifically, no hemorrhage, hydrocephalus, mass lesion, acute infarction, or significant intracranial injury. Vascular: No hyperdense vessel or unexpected calcification. Skull: No acute calvarial abnormality. Sinuses/Orbits: Visualized paranasal sinuses and mastoids clear. Orbital soft tissues unremarkable. Other: None IMPRESSION: Atrophy, chronic microvascular disease. No acute intracranial abnormality. Electronically Signed   By: Rolm Baptise M.D.   On: 03/12/2021 16:10   CT Cervical Spine Wo  Contrast  Result Date: 03/12/2021 CLINICAL DATA:  Fall down steps.  Neck trauma. EXAM: CT CERVICAL SPINE WITHOUT CONTRAST TECHNIQUE: Multidetector CT imaging of the cervical spine was performed without intravenous contrast. Multiplanar CT image reconstructions were also generated. COMPARISON:  None. FINDINGS: Alignment: No subluxation. Skull base and vertebrae: No acute fracture. No primary bone lesion or focal pathologic process. Soft tissues and spinal canal: No prevertebral fluid or swelling. No visible canal hematoma. Disc levels: Diffuse degenerative disc disease with anterior spurring. Moderate bilateral diffuse degenerative facet disease. Upper chest: No acute findings Other: None IMPRESSION: Degenerative disc and facet disease. No acute bony abnormality. Electronically Signed   By: Rolm Baptise M.D.   On: 03/12/2021 16:11   DG Femur 1 View Right  Result Date: 03/12/2021 CLINICAL DATA:  Right hip pain after fall today. EXAM: RIGHT FEMUR 1 VIEW COMPARISON:  None. FINDINGS: Moderately comminuted and displaced fracture is seen involving the intertrochanteric region of the proximal right femur. The middle and distal aspects of the femur are unremarkable. IMPRESSION: Moderately comminuted and displaced intertrochanteric fracture of proximal right femur. Electronically Signed   By: Marijo Conception  M.D.   On: 03/12/2021 17:13   DG HIPS BILAT WITH PELVIS MIN 5 VIEWS  Result Date: 03/12/2021 CLINICAL DATA:  Fall today, leg and hip pain. EXAM: DG HIP (WITH OR WITHOUT PELVIS) 5+V BILAT COMPARISON:  Femur evaluation of the same date. FINDINGS: Comminuted intratrochanteric fracture of the RIGHT proximal femur with varus angulation and moderate displacement of the lesser trochanter. Femoral head is located within the acetabulum. There is also apex anterior angulation seen on the cross-table lateral. LEFT hip is located without signs of fracture. There are signs of vascular disease. Bony pelvis without signs of  fracture. IMPRESSION: Comminuted, angulated intertrochanteric fracture of the proximal RIGHT femur with varus angulation and moderate displacement of the lesser trochanter. Electronically Signed   By: Zetta Bills M.D.   On: 03/12/2021 17:16   Family History Reviewed and non-contributory, no pertinent history of problems with bleeding or anesthesia    Review of Systems No SOB No chest pain    OBJECTIVE  Vitals:Patient Vitals for the past 8 hrs:  BP Temp Temp src Pulse Resp SpO2  03/13/21 0826 (!) 177/74 98.6 F (37 C) Oral 66 17 97 %  03/13/21 0500 (!) 167/69 -- -- -- 17 98 %  03/13/21 0400 (!) 171/70 -- -- 66 17 98 %  03/13/21 0130 (!) 151/75 -- -- 64 (!) 22 100 %   General: Alert, no acute distress.  Appears comfortable Cardiovascular: Extremities are warm Respiratory: No cyanosis, no use of accessory musculature Skin: No lesions in the area of chief complaint  Neurologic: Responds to light touch distally  Psychiatric: Patient does not answer questions appropriately  Lymphatic: No swelling obvious and reported other than the area involved in the exam below Extremities  RLE: Extremity held in a fixed position.  ROM deferred due to known fracture.  Responds to light touch on foot.  Dorsiflexes ankle and toes. 2+ DP pulse.  Toes are WWP.  Active motion intact in the TA/EHL/GS. LLE: Sensation is intact distally in the sural, saphenous, DP, SP, and plantar nerve distribution. 2+ DP pulse.  Toes are WWP.  Active motion intact in the TA/EHL/GS. Tolerates gentle ROM of the hip.  No pain with axial loading.     Test Results Imaging XR of the Right hip demonstrates a comminuted Intertrochanteric femur fracture.  Labs cbc Recent Labs    03/12/21 1441 03/13/21 0531  WBC 6.5 7.3  HGB 10.9* 10.2*  HCT 33.5* 31.0*  PLT 193 203    Labs inflam No results for input(s): CRP in the last 72 hours.  Invalid input(s): ESR  Labs coag No results for input(s): INR, PTT in the last 72  hours.  Invalid input(s): PT  Recent Labs    03/12/21 1441 03/13/21 0531  NA 124* 125*  K 4.3 3.9  CL 90* 92*  CO2 26 26  GLUCOSE 168* 192*  BUN 14 14  CREATININE 1.11* 0.88  CALCIUM 8.8* 8.7*

## 2021-03-13 NOTE — Evaluation (Signed)
Clinical/Bedside Swallow Evaluation Patient Details  Name: Latoya Perry MRN: [id number] Date of Birth: 08/29/1933  Today's Date: 03/13/2021 Time: SLP Start Time (ACUTE ONLY): 1502 SLP Stop Time (ACUTE ONLY): 1533 SLP Time Calculation (min) (ACUTE ONLY): 31 min  Past Medical History:  Past Medical History:  Diagnosis Date   Dementia (HCC)    Renal failure    Past Surgical History: History reviewed. No pertinent surgical history. HPI:  Latoya Perry is a 87 y.o. female with medical history significant for dementia, presented to hospital after sustaining a fall.  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  Patient has advanced dementia and is unable to communicate accurately.  Needs assistance with all ADLs.  In the ED patient was noted to have sodium of 124.  Head CT scan was unremarkable.  X-ray of the pelvis showed comminuted angulated intertrochanteric fracture of the proximal right femur.  Orthopedics was consulted and patient was admitted to hospital.   Assessment / Plan / Recommendation Clinical Impression  Clinical swallowing evaluation completed while Pt was sitting upright in bed presenting with a primary cognitive based dysphagia; Pt's sister (caregiver) present at bedside reports that she has had to puree/blend Pt's solid foods for the past 2-3 weeks or the Pt will "choke" and have coughing fits with regular textures but does well with liquids and puree. She further reports this afternoon Pt has been holding all presentations in her mouth. Upon assessment note Pt was holding cabbage in her mouth from lunch but when presented a tsp bite of food of choice (ice cream) she willingly expectorated held cabbage. Pt consumed puree textures with mild oral holding and mildly prolonged AP transit; straw sips after each bite facilitated oral tranist of puree bolus. Recommend initiate D1/puree diet and thin liquids with precautions re: alternate bites/sips, ensure Pt is alert and aware PO is being  presented, ensure Pt is sitting upright, always check Pt's oral cavity after PO is presented to ensure it has all been swallowed -- encourage/facilitate self-feeding. Recommend meds be administered crushed in yogurt or pudding (rather than applesauce). ST will continue to follow acutely. Above to RN. SLP Visit Diagnosis: Dysphagia, unspecified (R13.10)    Aspiration Risk  Mild aspiration risk;Risk for inadequate nutrition/hydration    Diet Recommendation Dysphagia 1 (Puree);Thin liquid   Liquid Administration via: Cup;Straw Medication Administration: Crushed with puree Supervision: Staff to assist with self feeding;Full supervision/cueing for compensatory strategies Compensations: Small sips/bites Postural Changes: Seated upright at 90 degrees;Remain upright for at least 30 minutes after po intake    Other  Recommendations Oral Care Recommendations: Oral care BID   Follow up Recommendations Skilled Nursing facility;24 hour supervision/assistance      Frequency and Duration min 1 x/week  1 week       Prognosis Prognosis for Safe Diet Advancement: Fair Barriers to Reach Goals: Cognitive deficits      Swallow Study   General Date of Onset: 03/12/21 HPI: Latoya Perry is a 87 y.o. female with medical history significant for dementia, presented to hospital after sustaining a fall.  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  Patient has advanced dementia and is unable to communicate accurately.  Needs assistance with all ADLs.  In the ED patient was noted to have sodium of 124.  Head CT scan was unremarkable.  X-ray of the pelvis showed comminuted angulated intertrochanteric fracture of the proximal right femur.  Orthopedics was consulted and patient was admitted to hospital. Type of Study: Bedside Swallow Evaluation Previous Swallow Assessment: none in  chart Diet Prior to this Study: NPO Temperature Spikes Noted: No Respiratory Status: Room air History of Recent Intubation:  No Behavior/Cognition: Alert;Cooperative;Pleasant mood;Confused Oral Cavity Assessment: Within Functional Limits Oral Cavity - Dentition: Missing dentition;Edentulous Self-Feeding Abilities: Total assist;Needs assist Patient Positioning: Upright in bed Baseline Vocal Quality: Normal Volitional Cough: Cognitively unable to elicit Volitional Swallow: Unable to elicit    Oral/Motor/Sensory Function Overall Oral Motor/Sensory Function: Within functional limits   Ice Chips Ice chips: Within functional limits   Thin Liquid Thin Liquid: Impaired Presentation: Straw Oral Phase Impairments: Poor awareness of bolus Oral Phase Functional Implications: Oral holding;Oral residue    Nectar Thick Nectar Thick Liquid: Not tested   Honey Thick Honey Thick Liquid: Not tested   Puree Puree: Within functional limits   Solid     Solid: Not tested     Latoya Perry, Latoya Perry Speech Language Pathologist  Georgetta Haber 03/13/2021,4:14 PM

## 2021-03-13 NOTE — ED Notes (Addendum)
Dr Tyson Babinski texted.

## 2021-03-13 NOTE — ED Notes (Signed)
Family at bedside. 

## 2021-03-13 NOTE — Telephone Encounter (Signed)
Call received from patient's designated/emergency contact, sister Lilian Kapur, ph# [phone], relaying that she had a call from Dr Dallas Schimke. States she is in room with sister/patient.

## 2021-03-14 DIAGNOSIS — S72001A Fracture of unspecified part of neck of right femur, initial encounter for closed fracture: Secondary | ICD-10-CM | POA: Diagnosis not present

## 2021-03-14 DIAGNOSIS — F0390 Unspecified dementia without behavioral disturbance: Secondary | ICD-10-CM | POA: Diagnosis not present

## 2021-03-14 DIAGNOSIS — E871 Hypo-osmolality and hyponatremia: Secondary | ICD-10-CM | POA: Diagnosis not present

## 2021-03-14 LAB — BASIC METABOLIC PANEL
Anion gap: 7 (ref 5–15)
Anion gap: 7 (ref 5–15)
BUN: 17 mg/dL (ref 8–23)
BUN: 22 mg/dL (ref 8–23)
CO2: 27 mmol/L (ref 22–32)
CO2: 27 mmol/L (ref 22–32)
Calcium: 8.7 mg/dL — ABNORMAL LOW (ref 8.9–10.3)
Calcium: 8.3 mg/dL — ABNORMAL LOW (ref 8.9–10.3)
Chloride: 91 mmol/L — ABNORMAL LOW (ref 98–111)
Chloride: 90 mmol/L — ABNORMAL LOW (ref 98–111)
Creatinine, Ser: 1.08 mg/dL — ABNORMAL HIGH (ref 0.44–1.00)
Creatinine, Ser: 1.33 mg/dL — ABNORMAL HIGH (ref 0.44–1.00)
GFR, Estimated: 50 mL/min — ABNORMAL LOW (ref >60)
GFR, Estimated: 39 mL/min — ABNORMAL LOW (ref >60)
Glucose, Bld: 180 mg/dL — ABNORMAL HIGH (ref 70–99)
Glucose, Bld: 244 mg/dL — ABNORMAL HIGH (ref 70–99)
Potassium: 3.7 mmol/L (ref 3.5–5.1)
Potassium: 3.8 mmol/L (ref 3.5–5.1)
Sodium: 125 mmol/L — ABNORMAL LOW (ref 135–145)
Sodium: 124 mmol/L — ABNORMAL LOW (ref 135–145)

## 2021-03-14 LAB — OSMOLALITY, URINE: Osmolality, Ur: 478 mOsm/kg (ref 300–900)

## 2021-03-14 LAB — SODIUM, URINE, RANDOM: Sodium, Ur: <10 mmol/L

## 2021-03-14 LAB — SURGICAL PCR SCREEN
MRSA, PCR: NEGATIVE
Staphylococcus aureus: NEGATIVE

## 2021-03-14 MED ORDER — CHLORHEXIDINE GLUCONATE CLOTH 2 % EX PADS
6.0000 | MEDICATED_PAD | Freq: Every day | CUTANEOUS | Status: DC
  Administered 2021-03-14 – 2021-03-19 (×6): 6 via TOPICAL

## 2021-03-14 MED ORDER — SODIUM CHLORIDE 1 G PO TABS
1.0000 g | ORAL_TABLET | Freq: Three times a day (TID) | ORAL | Status: DC
  Administered 2021-03-15 – 2021-03-17 (×5): 1 g via ORAL
  Filled 2021-03-14 (×5): qty 1

## 2021-03-14 NOTE — Progress Notes (Addendum)
PROGRESS NOTE  Alvie Speltz TIW:[id number] DOB: 09/07/1933 DOA: 03/12/2021 PCP: Smith Robert, MD   LOS: 2 days   Brief narrative:  Sun Kihn is a 87 y.o. female with medical history significant for dementia, presented to hospital after sustaining a fall.  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  Patient has advanced dementia and is unable to communicate accurately.  Needs assistance with all ADLs.  In the ED, patient was noted to have sodium of 124.  Head CT scan was unremarkable.  X-ray of the pelvis showed comminuted angulated intertrochanteric fracture of the proximal right femur.  Orthopedics was consulted and patient was admitted to hospital.  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  Orthopedics on board.  Continue supportive care including pain management.  Patient will need hip surgery but has low sodium levels.    Acute urinary retention.  Patient will need surgical intervention.  She has hip pain and discomfort and In-N-Out cath was difficult.  We will consider Foley catheter placement today.  Hyponatremia-sodium 125, unsure chronicity but Baseline sodium of 131 or more.  Received normal saline.  Was on HCTZ and Lexapro currently on hold.  Serum osmolality low.  Urine osmolality urine sodium pending.  Spoke with the nursing staff about getting urinary specimen.  Serum cortisol and TSH within normal limits.  Prolonged QTC-508.  No prior EKGs.  Continue to hold Lexapro.  Monitor electrolytes.  Telemetry monitor.   Dementia-advanced.  Needing assistance with ADLs.  Continue donepezil, memantine, hold Lexapro for now.seen by  speech therapy and recommended dysphagia 1 diet  Essential HTN We will continue to hold HCTZ.  Continue enalapril and Coreg.   Hypothyroidism Continue Synthroid   CKD 3a- cr 1.0.  Stable.      DVT prophylaxis: SCDs Start: 03/12/21 2111   Code Status: Full code  Family Communication: None today.  Spoke with the patient's family at bedside yesterday.  Status is: Inpatient  Remains inpatient appropriate because:Ongoing active pain requiring inpatient pain management, Unsafe d/c plan, IV treatments appropriate due to intensity of illness or inability to take PO, Inpatient level of care appropriate due to severity of illness, and need for surgical intervention, electrolyte imbalance  Dispo: The patient is from: Home              Anticipated d/c is to: SNF              Patient currently is not medically stable to d/c.   Difficult to place patient No   Consultants: Orthopedics  Procedures: None  Anti-infectives:  Preop  Anti-infectives (From admission, onward)    Start     Dose/Rate Route Frequency Ordered Stop   03/13/21 0600  ceFAZolin (ANCEF) IVPB 2g/100 mL premix        2 g 200 mL/hr over 30 Minutes Intravenous On call to O.R. 03/12/21 1808 03/14/21 0559       Subjective: Today, patient was seen and examined at bedside.  Staff was unable to obtain urinary sample.  Has advanced dementia.  Poor historian  Objective: Vitals:   03/13/21 1909 03/14/21 0452  BP: (!) 113/47 (!) 115/49  Pulse: 68 (!) 59  Resp: 19 19  Temp: 98.5 F (  36.9 C) 98 F (36.7 C)  SpO2: 100% 100%    Intake/Output Summary (Last 24 hours) at 03/14/2021 0958 Last data filed at 03/13/2021 2153 Gross per 24 hour  Intake 120 ml  Output --  Net 120 ml    Filed Weights   03/12/21 1420  Weight: 67.6 kg   Body mass index is 22 kg/m.   Physical Exam:  GENERAL: Patient is alert awake mildly communicative, elderly female, not in obvious distress. HENT: No scleral pallor or icterus.  Pupils equally reactive to light. Oral mucosa is moist NECK: is supple, no gross swelling noted. CHEST: Clear to auscultation. No crackles or wheezes.  Diminished breath sounds bilaterally. CVS: S1 and S2 heard, no murmur. Regular rate and rhythm.  ABDOMEN: Soft, non-tender, bowel sounds are present. EXTREMITIES: No edema.  Right hip externally rotated. CNS: Nonfocal.  Right hip externally rotated.  SKIN: warm and dry without rashes.  Data Review: I have personally reviewed the following laboratory data and studies,  CBC: Recent Labs  Lab 03/12/21 1441 03/13/21 0531  WBC 6.5 7.3  NEUTROABS 4.7  --   HGB 10.9* 10.2*  HCT 33.5* 31.0*  MCV 92.8 91.7  PLT 193 203    Basic Metabolic Panel: Recent Labs  Lab 03/12/21 1441 03/13/21 0531 03/13/21 1807 03/14/21 0415  NA 124* 125* 123* 125*  K 4.3 3.9 3.8 3.7  CL 90* 92* 88* 91*  CO2 26 26 28 27   GLUCOSE 168* 192* 231* 180*  BUN 14 14 15 17   CREATININE 1.11* 0.88 1.02* 1.08*  CALCIUM 8.8* 8.7* 8.4* 8.7*  MG 1.6*  --   --   --     Liver Function Tests: Recent Labs  Lab 03/12/21 1441  AST 26  ALT 19  ALKPHOS 72  BILITOT 0.8  PROT 6.4*  ALBUMIN 3.2*    No results for input(s): LIPASE, AMYLASE in the last 168 hours. No results for input(s): AMMONIA in the last 168 hours. Cardiac Enzymes: No results for input(s): CKTOTAL, CKMB, CKMBINDEX, TROPONINI in the last 168 hours. BNP (last 3 results) No results for input(s): BNP in the last 8760 hours.  ProBNP (last 3 results) No results for input(s): PROBNP in the last 8760 hours.  CBG: No results for input(s): GLUCAP in the last 168 hours. Recent Results (from the past 240 hour(s))  Resp Panel by RT-PCR (Flu A&B, Covid) Nasopharyngeal Swab     Status: None   Collection Time: 03/12/21  2:57 PM   Specimen: Nasopharyngeal Swab; Nasopharyngeal(NP) swabs in vial transport medium  Result Value Ref Range Status   SARS Coronavirus 2 by RT PCR NEGATIVE NEGATIVE Final     Comment: (NOTE) SARS-CoV-2 target nucleic acids are NOT DETECTED.  The SARS-CoV-2 RNA is generally detectable in upper respiratory specimens during the acute phase of infection. The lowest concentration of SARS-CoV-2 viral copies this assay can detect is 138 copies/mL. A negative result does not preclude SARS-Cov-2 infection and should not be used as the sole basis for treatment or other patient management decisions. A negative result may occur with  improper specimen collection/handling, submission of specimen other than nasopharyngeal swab, presence of viral mutation(s) within the areas targeted by this assay, and inadequate number of viral copies(<138 copies/mL). A negative result must be combined with clinical observations, patient history, and epidemiological information. The expected result is Negative.  Fact Sheet for Patients:  03/14/21  Fact Sheet for Healthcare Providers:  03/14/21  This test is no t yet approved or  cleared by the [place] and  has been authorized for detection and/or diagnosis of SARS-CoV-2 by FDA under an Emergency Use Authorization (EUA). This EUA will remain  in effect (meaning this test can be used) for the duration of the COVID-19 declaration under Section 564(b)(1) of the Act, 21 U.S.C.section 360bbb-3(b)(1), unless the authorization is terminated  or revoked sooner.       Influenza A by PCR NEGATIVE NEGATIVE Final   Influenza B by PCR NEGATIVE NEGATIVE Final    Comment: (NOTE) The Xpert Xpress SARS-CoV-2/FLU/RSV plus assay is intended as an aid in the diagnosis of influenza from Nasopharyngeal swab specimens and should not be used as a sole basis for treatment. Nasal washings and aspirates are unacceptable for Xpert Xpress SARS-CoV-2/FLU/RSV testing.  Fact Sheet for Patients: BloggerCourse.com  Fact Sheet for Healthcare  Providers: SeriousBroker.it  This test is not yet approved or cleared by the [place] FDA and has been authorized for detection and/or diagnosis of SARS-CoV-2 by FDA under an Emergency Use Authorization (EUA). This EUA will remain in effect (meaning this test can be used) for the duration of the COVID-19 declaration under Section 564(b)(1) of the Act, 21 U.S.C. section 360bbb-3(b)(1), unless the authorization is terminated or revoked.  Performed at [facility], [address] [place], [place] [address]       Studies: DG Chest 1 View  Result Date: 03/12/2021 CLINICAL DATA:  Right hip fracture. EXAM: CHEST  1 VIEW COMPARISON:  None. FINDINGS: The heart size and mediastinal contours are within normal limits. Both lungs are clear. The visualized skeletal structures are unremarkable. IMPRESSION: No active disease. Aortic Atherosclerosis (ICD10-I70.0). Electronically Signed   By: Lupita Raider M.D.   On: 03/12/2021 17:15   DG Tibia/Fibula Right  Result Date: 03/12/2021 CLINICAL DATA:  Right leg pain after fall today. EXAM: RIGHT TIBIA AND FIBULA - 2 VIEW COMPARISON:  None. FINDINGS: There is no evidence of fracture or other focal bone lesions. Soft tissues are unremarkable. IMPRESSION: Negative. Electronically Signed   By: Lupita Raider M.D.   On: 03/12/2021 17:14   CT Head Wo Contrast  Result Date: 03/12/2021 CLINICAL DATA:  Head trauma, moderate to severe.  Fall down steps EXAM: CT HEAD WITHOUT CONTRAST TECHNIQUE: Contiguous axial images were obtained from the base of the skull through the vertex without intravenous contrast. COMPARISON:  None. FINDINGS: Brain: There is atrophy and chronic small vessel disease changes. No acute intracranial abnormality. Specifically, no hemorrhage, hydrocephalus, mass lesion, acute infarction, or significant intracranial injury. Vascular: No hyperdense vessel or unexpected calcification. Skull: No acute calvarial abnormality.  Sinuses/Orbits: Visualized paranasal sinuses and mastoids clear. Orbital soft tissues unremarkable. Other: None IMPRESSION: Atrophy, chronic microvascular disease. No acute intracranial abnormality. Electronically Signed   By: Charlett Nose M.D.   On: 03/12/2021 16:10   CT Cervical Spine Wo Contrast  Result Date: 03/12/2021 CLINICAL DATA:  Fall down steps.  Neck trauma. EXAM: CT CERVICAL SPINE WITHOUT CONTRAST TECHNIQUE: Multidetector CT imaging of the cervical spine was performed without intravenous contrast. Multiplanar CT image reconstructions were also generated. COMPARISON:  None. FINDINGS: Alignment: No subluxation. Skull base and vertebrae: No acute fracture. No primary bone lesion or focal pathologic process. Soft tissues and spinal canal: No prevertebral fluid or swelling. No visible canal hematoma. Disc levels: Diffuse degenerative disc disease with anterior spurring. Moderate bilateral diffuse degenerative facet disease. Upper chest: No acute findings Other: None IMPRESSION: Degenerative disc and facet disease. No acute bony abnormality. Electronically Signed  By: Charlett NoseKevin  Dover M.D.   On: 03/12/2021 16:11   DG Femur 1 View Right  Result Date: 03/12/2021 CLINICAL DATA:  Right hip pain after fall today. EXAM: RIGHT FEMUR 1 VIEW COMPARISON:  None. FINDINGS: Moderately comminuted and displaced fracture is seen involving the intertrochanteric region of the proximal right femur. The middle and distal aspects of the femur are unremarkable. IMPRESSION: Moderately comminuted and displaced intertrochanteric fracture of proximal right femur. Electronically Signed   By: Lupita RaiderJames  Green Jr M.D.   On: 03/12/2021 17:13   DG HIPS BILAT WITH PELVIS MIN 5 VIEWS  Result Date: 03/12/2021 CLINICAL DATA:  Fall today, leg and hip pain. EXAM: DG HIP (WITH OR WITHOUT PELVIS) 5+V BILAT COMPARISON:  Femur evaluation of the same date. FINDINGS: Comminuted intratrochanteric fracture of the RIGHT proximal femur with varus  angulation and moderate displacement of the lesser trochanter. Femoral head is located within the acetabulum. There is also apex anterior angulation seen on the cross-table lateral. LEFT hip is located without signs of fracture. There are signs of vascular disease. Bony pelvis without signs of fracture. IMPRESSION: Comminuted, angulated intertrochanteric fracture of the proximal RIGHT femur with varus angulation and moderate displacement of the lesser trochanter. Electronically Signed   By: Donzetta KohutGeoffrey  Wile M.D.   On: 03/12/2021 17:16      Joycelyn DasLaxman Paisyn Guercio, MD  [facility] 03/14/2021  If 7PM-7AM, please contact night-coverage

## 2021-03-14 NOTE — Progress Notes (Signed)
PRN pain medication was given prior to movement and insertion of foley. 89F foley was inserted. Urine return noted and sample collected.

## 2021-03-14 NOTE — Progress Notes (Signed)
Surgical consent signed by sister.

## 2021-03-14 NOTE — Progress Notes (Signed)
Pt needed Urine sample for procedure tomorrow. In and out catheter ordered. Attempted twice by [place], LPN and Joycelyn Man. Attempt was unsuccessful. MD notified. Pt did not tolerate well. Bladder scan preformed and showed 260 ML. MD ordered Foley to be inserted.

## 2021-03-15 ENCOUNTER — Inpatient Hospital Stay (HOSPITAL_COMMUNITY): Payer: Medicare Other

## 2021-03-15 ENCOUNTER — Encounter (HOSPITAL_COMMUNITY): Payer: Self-pay | Admitting: Internal Medicine

## 2021-03-15 ENCOUNTER — Encounter (HOSPITAL_COMMUNITY)
Admission: EM | Disposition: A | Discharge status: Skilled Nursing Facility | Payer: Self-pay | Source: Home / Self Care | Attending: Internal Medicine

## 2021-03-15 ENCOUNTER — Inpatient Hospital Stay (HOSPITAL_COMMUNITY): Payer: Medicare Other | Admitting: Anesthesiology

## 2021-03-15 DIAGNOSIS — F0390 Unspecified dementia without behavioral disturbance: Secondary | ICD-10-CM | POA: Diagnosis not present

## 2021-03-15 DIAGNOSIS — E871 Hypo-osmolality and hyponatremia: Secondary | ICD-10-CM | POA: Diagnosis not present

## 2021-03-15 DIAGNOSIS — S72001A Fracture of unspecified part of neck of right femur, initial encounter for closed fracture: Secondary | ICD-10-CM | POA: Diagnosis not present

## 2021-03-15 DIAGNOSIS — W109XXA Fall (on) (from) unspecified stairs and steps, initial encounter: Secondary | ICD-10-CM | POA: Diagnosis not present

## 2021-03-15 HISTORY — PX: INTRAMEDULLARY (IM) NAIL INTERTROCHANTERIC: SHX5875

## 2021-03-15 LAB — BASIC METABOLIC PANEL
Anion gap: 5 (ref 5–15)
BUN: 22 mg/dL (ref 8–23)
CO2: 30 mmol/L (ref 22–32)
Calcium: 8.4 mg/dL — ABNORMAL LOW (ref 8.9–10.3)
Chloride: 93 mmol/L — ABNORMAL LOW (ref 98–111)
Creatinine, Ser: 1.2 mg/dL — ABNORMAL HIGH (ref 0.44–1.00)
GFR, Estimated: 44 mL/min — ABNORMAL LOW (ref >60)
Glucose, Bld: 204 mg/dL — ABNORMAL HIGH (ref 70–99)
Potassium: 4.2 mmol/L (ref 3.5–5.1)
Sodium: 128 mmol/L — ABNORMAL LOW (ref 135–145)

## 2021-03-15 SURGERY — FIXATION, FRACTURE, INTERTROCHANTERIC, WITH INTRAMEDULLARY ROD
Anesthesia: General | Site: Hip | Laterality: Right

## 2021-03-15 MED ORDER — CHLORHEXIDINE GLUCONATE 0.12 % MT SOLN: 15.0000 mL | Freq: Once | OROMUCOSAL | Status: DC

## 2021-03-15 MED ORDER — CEFAZOLIN SODIUM-DEXTROSE 2-4 GM/100ML-% IV SOLN
INTRAVENOUS | Status: AC
  Filled 2021-03-15: qty 100

## 2021-03-15 MED ORDER — CEFAZOLIN SODIUM-DEXTROSE 2-4 GM/100ML-% IV SOLN: 2.0000 g | INTRAVENOUS | Status: DC

## 2021-03-15 MED ORDER — BUPIVACAINE HCL (PF) 0.5 % IJ SOLN
INTRAMUSCULAR | Status: DC | PRN
  Administered 2021-03-15: 30 mL

## 2021-03-15 MED ORDER — FENTANYL CITRATE (PF) 100 MCG/2ML IJ SOLN
INTRAMUSCULAR | Status: DC | PRN
  Administered 2021-03-15 (×4): 50 ug via INTRAVENOUS

## 2021-03-15 MED ORDER — LIDOCAINE HCL (PF) 2 % IJ SOLN
INTRAMUSCULAR | Status: AC
  Filled 2021-03-15: qty 5

## 2021-03-15 MED ORDER — BUPIVACAINE HCL (PF) 0.5 % IJ SOLN
INTRAMUSCULAR | Status: AC
  Filled 2021-03-15: qty 30

## 2021-03-15 MED ORDER — SUCCINYLCHOLINE CHLORIDE 200 MG/10ML IV SOSY
PREFILLED_SYRINGE | INTRAVENOUS | Status: DC | PRN
  Administered 2021-03-15: 120 mg via INTRAVENOUS

## 2021-03-15 MED ORDER — LIDOCAINE 2% (20 MG/ML) 5 ML SYRINGE
INTRAMUSCULAR | Status: DC | PRN
  Administered 2021-03-15: 80 mg via INTRAVENOUS

## 2021-03-15 MED ORDER — SODIUM CHLORIDE 0.9 % IR SOLN
Status: DC | PRN
  Administered 2021-03-15: 1000 mL

## 2021-03-15 MED ORDER — LACTATED RINGERS IV SOLN
INTRAVENOUS | Status: DC
  Administered 2021-03-15: 1000 mL via INTRAVENOUS

## 2021-03-15 MED ORDER — PROPOFOL 10 MG/ML IV BOLUS
INTRAVENOUS | Status: AC
  Filled 2021-03-15: qty 20

## 2021-03-15 MED ORDER — PHENYLEPHRINE HCL-NACL 10-0.9 MG/250ML-% IV SOLN
INTRAVENOUS | Status: AC
  Filled 2021-03-15: qty 250

## 2021-03-15 MED ORDER — TRANEXAMIC ACID-NACL 1000-0.7 MG/100ML-% IV SOLN: 1000.0000 mg | INTRAVENOUS | Status: DC

## 2021-03-15 MED ORDER — ONDANSETRON HCL 4 MG/2ML IJ SOLN
INTRAMUSCULAR | Status: DC | PRN
  Administered 2021-03-15: 4 mg via INTRAVENOUS

## 2021-03-15 MED ORDER — PROPOFOL 10 MG/ML IV BOLUS
INTRAVENOUS | Status: DC | PRN
  Administered 2021-03-15: 80 mg via INTRAVENOUS

## 2021-03-15 MED ORDER — ORAL CARE MOUTH RINSE: 15.0000 mL | Freq: Once | OROMUCOSAL | Status: DC

## 2021-03-15 MED ORDER — SUGAMMADEX SODIUM 200 MG/2ML IV SOLN
INTRAVENOUS | Status: DC | PRN
  Administered 2021-03-15: 200 mg via INTRAVENOUS

## 2021-03-15 MED ORDER — DEXAMETHASONE SODIUM PHOSPHATE 10 MG/ML IJ SOLN
INTRAMUSCULAR | Status: DC | PRN
  Administered 2021-03-15: 4 mg via INTRAVENOUS

## 2021-03-15 MED ORDER — FENTANYL CITRATE (PF) 100 MCG/2ML IJ SOLN
INTRAMUSCULAR | Status: AC
  Filled 2021-03-15: qty 2

## 2021-03-15 MED ORDER — TRANEXAMIC ACID-NACL 1000-0.7 MG/100ML-% IV SOLN
INTRAVENOUS | Status: AC
  Filled 2021-03-15: qty 100

## 2021-03-15 MED ORDER — PHENYLEPHRINE 40 MCG/ML (10ML) SYRINGE FOR IV PUSH (FOR BLOOD PRESSURE SUPPORT)
PREFILLED_SYRINGE | INTRAVENOUS | Status: DC | PRN
  Administered 2021-03-15: 80 ug via INTRAVENOUS

## 2021-03-15 MED ORDER — PHENYLEPHRINE 40 MCG/ML (10ML) SYRINGE FOR IV PUSH (FOR BLOOD PRESSURE SUPPORT)
PREFILLED_SYRINGE | INTRAVENOUS | Status: AC
  Filled 2021-03-15: qty 10

## 2021-03-15 MED ORDER — CEFAZOLIN SODIUM-DEXTROSE 2-4 GM/100ML-% IV SOLN
2.0000 g | Freq: Three times a day (TID) | INTRAVENOUS | Status: AC
Start: 2021-03-15 — End: 2021-03-16
  Administered 2021-03-15 – 2021-03-16 (×3): 2 g via INTRAVENOUS
  Filled 2021-03-15 (×4): qty 100

## 2021-03-15 MED ORDER — TRANEXAMIC ACID-NACL 1000-0.7 MG/100ML-% IV SOLN
1000.0000 mg | INTRAVENOUS | Status: AC
  Administered 2021-03-15: 1000 mg via INTRAVENOUS

## 2021-03-15 MED ORDER — ROCURONIUM BROMIDE 10 MG/ML (PF) SYRINGE
PREFILLED_SYRINGE | INTRAVENOUS | Status: DC | PRN
  Administered 2021-03-15: 30 mg via INTRAVENOUS

## 2021-03-15 MED ORDER — ONDANSETRON HCL 4 MG/2ML IJ SOLN
INTRAMUSCULAR | Status: AC
  Filled 2021-03-15: qty 2

## 2021-03-15 MED ORDER — SUCCINYLCHOLINE CHLORIDE 200 MG/10ML IV SOSY
PREFILLED_SYRINGE | INTRAVENOUS | Status: AC
  Filled 2021-03-15: qty 10

## 2021-03-15 MED ORDER — CEFAZOLIN SODIUM-DEXTROSE 2-4 GM/100ML-% IV SOLN
2.0000 g | INTRAVENOUS | Status: AC
  Administered 2021-03-15: 2 g via INTRAVENOUS

## 2021-03-15 MED ORDER — ROCURONIUM BROMIDE 10 MG/ML (PF) SYRINGE
PREFILLED_SYRINGE | INTRAVENOUS | Status: AC
  Filled 2021-03-15: qty 10

## 2021-03-15 SURGICAL SUPPLY — 53 items
BIT DRILL 4.0X280 (BIT) ×2 IMPLANT
BLADE SURG SZ10 CARB STEEL (BLADE) ×4 IMPLANT
BNDG GAUZE ELAST 4 BULKY (GAUZE/BANDAGES/DRESSINGS) ×2 IMPLANT
BRUSH SCRUB EZ W/ULTRADEX 3%PC (MISCELLANEOUS) ×2 IMPLANT
CHLORAPREP W/TINT 26 (MISCELLANEOUS) ×2 IMPLANT
CLOTH BEACON ORANGE TIMEOUT ST (SAFETY) ×2 IMPLANT
COVER LIGHT HANDLE STERIS (MISCELLANEOUS) ×4 IMPLANT
COVER MAYO STAND XLG (MISCELLANEOUS) ×2 IMPLANT
COVER PERINEAL POST (MISCELLANEOUS) ×2 IMPLANT
DRAPE HALF SHEET 40X57 (DRAPES) ×2 IMPLANT
DRAPE INCISE IOBAN 44X35 STRL (DRAPES) ×2 IMPLANT
DRAPE STERI IOBAN 125X83 (DRAPES) ×2 IMPLANT
DRSG PAD ABDOMINAL 8X10 ST (GAUZE/BANDAGES/DRESSINGS) ×2 IMPLANT
DRSG TEGADERM 4X4.75 (GAUZE/BANDAGES/DRESSINGS) ×8 IMPLANT
ELECT REM PT RETURN 9FT ADLT (ELECTROSURGICAL) ×2
ELECTRODE REM PT RTRN 9FT ADLT (ELECTROSURGICAL) ×1 IMPLANT
GAUZE SPONGE 4X4 12PLY STRL (GAUZE/BANDAGES/DRESSINGS) ×2 IMPLANT
GAUZE XEROFORM 1X8 LF (GAUZE/BANDAGES/DRESSINGS) ×4 IMPLANT
GLOVE SKINSENSE NS SZ8.0 LF (GLOVE) ×2
GLOVE SKINSENSE STRL SZ8.0 LF (GLOVE) ×2 IMPLANT
GLOVE SRG 8 PF TXTR STRL LF DI (GLOVE) ×1 IMPLANT
GLOVE SURG UNDER POLY LF SZ7 (GLOVE) ×4 IMPLANT
GLOVE SURG UNDER POLY LF SZ8 (GLOVE) ×1
GOWN STRL REUS W/ TWL XL LVL3 (GOWN DISPOSABLE) ×1 IMPLANT
GOWN STRL REUS W/TWL LRG LVL3 (GOWN DISPOSABLE) ×2 IMPLANT
GOWN STRL REUS W/TWL XL LVL3 (GOWN DISPOSABLE) ×1
GUIDEWIRE BALL NOSE 3.0X900 (WIRE) ×1
GUIDEWIRE ORTH 900X3XBALL NOSE (WIRE) ×1 IMPLANT
INST SET MAJOR BONE (KITS) ×2 IMPLANT
KIT BLADEGUARD II DBL (SET/KITS/TRAYS/PACK) ×2 IMPLANT
KIT TURNOVER CYSTO (KITS) ×2 IMPLANT
MANIFOLD NEPTUNE II (INSTRUMENTS) ×2 IMPLANT
MARKER SKIN DUAL TIP RULER LAB (MISCELLANEOUS) ×2 IMPLANT
NAIL LONG ES 125D 11X36 (Nail) ×2 IMPLANT
NEEDLE HYPO 21X1.5 SAFETY (NEEDLE) ×2 IMPLANT
NS IRRIG 1000ML POUR BTL (IV SOLUTION) ×2 IMPLANT
PACK BASIC III (CUSTOM PROCEDURE TRAY) ×1
PACK SRG BSC III STRL LF ECLPS (CUSTOM PROCEDURE TRAY) ×1 IMPLANT
PAD ARMBOARD 7.5X6 YLW CONV (MISCELLANEOUS) ×2 IMPLANT
PENCIL SMOKE EVACUATOR COATED (MISCELLANEOUS) ×2 IMPLANT
PIN GUIDE THRD AR 3.2X330 (PIN) ×2 IMPLANT
SCREW LOCK CORT 5.0X38 (Screw) ×2 IMPLANT
SCREW LOCK LAG 10.5X95 GALILEO (Screw) ×2 IMPLANT
SET BASIN LINEN APH (SET/KITS/TRAYS/PACK) ×2 IMPLANT
SPONGE T-LAP 18X18 ~~LOC~~+RFID (SPONGE) ×4 IMPLANT
STAPLER VISISTAT (STAPLE) ×2 IMPLANT
SUT MON AB 2-0 CT1 36 (SUTURE) ×2 IMPLANT
SUT VIC AB 0 CT1 27 (SUTURE) ×1
SUT VIC AB 0 CT1 27XBRD ANTBC (SUTURE) ×1 IMPLANT
SYR 30ML LL (SYRINGE) ×2 IMPLANT
SYR BULB IRRIG 60ML STRL (SYRINGE) ×4 IMPLANT
TOOL ACTIVATION (INSTRUMENTS) ×2 IMPLANT
YANKAUER SUCT BULB TIP NO VENT (SUCTIONS) ×2 IMPLANT

## 2021-03-15 NOTE — Anesthesia Preprocedure Evaluation (Addendum)
Anesthesia Evaluation  Patient identified by MRN, date of birth, ID band Patient awake    Reviewed: Allergy & Precautions, H&P , NPO status , Patient's Chart, lab work & pertinent test results, reviewed documented beta blocker date and time   Airway Mallampati: II  TM Distance: >3 FB Neck ROM: full    Dental no notable dental hx. (+) Missing, Edentulous Upper   Pulmonary neg pulmonary ROS,    Pulmonary exam normal breath sounds clear to auscultation       Cardiovascular Exercise Tolerance: Good negative cardio ROS   Rhythm:regular Rate:Normal     Neuro/Psych PSYCHIATRIC DISORDERS Dementia negative neurological ROS     GI/Hepatic negative GI ROS, Neg liver ROS,   Endo/Other  negative endocrine ROS  Renal/GU ARFRenal disease  negative genitourinary   Musculoskeletal   Abdominal   Peds  Hematology negative hematology ROS (+)   Anesthesia Other Findings Patient was admitted with hyponatremia, which has been worked up and has not improved.  Given the danger of waiting any longer, we will proceed despite acute on chronic hyponatremia.  Before 10AM, pt was given crushed medications in a tsp of applesauce.  Given her issues with dysphagia, it's hard to say whether any residual would be in the esophagus, stomach, or already passed through.  Because of this uncertainty, the correct management would be GETA with rapid sequence intubation.  As such, there is no advantage to delaying surgery.     Reproductive/Obstetrics negative OB ROS                            Anesthesia Physical Anesthesia Plan  ASA: 3 and emergent  Anesthesia Plan: General   Post-op Pain Management:    Induction:   PONV Risk Score and Plan:   Airway Management Planned:   Additional Equipment:   Intra-op Plan:   Post-operative Plan:   Informed Consent: I have reviewed the patients History and Physical, chart, labs  and discussed the procedure including the risks, benefits and alternatives for the proposed anesthesia with the patient or authorized representative who has indicated his/her understanding and acceptance.     Dental Advisory Given  Plan Discussed with: CRNA  Anesthesia Plan Comments:        Anesthesia Quick Evaluation

## 2021-03-15 NOTE — Anesthesia Procedure Notes (Signed)
Procedure Name: Intubation Date/Time: 03/15/2021 3:03 PM Performed by: Julian Reil, CRNA Pre-anesthesia Checklist: Patient identified, Emergency Drugs available, Suction available and Patient being monitored Patient Re-evaluated:Patient Re-evaluated prior to induction Oxygen Delivery Method: Circle system utilized Induction Type: IV induction and Rapid sequence Ventilation: Mask ventilation without difficulty Laryngoscope Size: Miller and 3 Grade View: Grade I Tube type: Oral Tube size: 7.0 mm Number of attempts: 1 Airway Equipment and Method: Stylet Placement Confirmation: ETT inserted through vocal cords under direct vision, positive ETCO2 and breath sounds checked- equal and bilateral Secured at: 22 cm Tube secured with: Tape Dental Injury: Teeth and Oropharynx as per pre-operative assessment  Comments: DL, noted scant amount clear secretions in post pharynx.

## 2021-03-15 NOTE — [place] Notes (Signed)
PROGRESS NOTE  Latoya Perry XIP:[id number] DOB: [birth year] DOA: 03/12/2021 PCP: Smith Robert, MD   LOS: 3 days   Brief narrative:  Latoya Perry is a 87 y.o. female with medical history significant for dementia, presented to hospital after sustaining a fall.  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  Patient has advanced dementia and is unable to communicate accurately.  Needs assistance with all ADLs.  In the ED, patient was noted to have sodium of 124.  Head CT scan was unremarkable.  X-ray of the pelvis showed comminuted angulated intertrochanteric fracture of the proximal right femur.  Orthopedics was consulted and patient was admitted to hospital.  During hospitalization work-up for hyponatremia was obtained.  Urinary sodium was low but serum osmolality was low and urine osmolality-she was started on salt tablets.  She however clinically remained stable.  Finally decision was made to proceed with surgery.  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  Orthopedics on board.  Continue supportive care including pain management.  Plan for hip surgery today.  Acute urinary retention.  Patient with hip fracture needing surgery.  In and out was difficult.  Continue Foley catheter for now.  Hyponatremia-sodium 124, initially received normal saline.  Off hydrochlorothiazide and Lexapro still sodium at 124 but clinically stable.    Serum osmolality low.  Urine osmolality high but urine sodium low.  Serum cortisol and TSH within normal limits.  Has been started on salt tablets.  We will continue to monitor closely  Prolonged QTC-508.  No prior EKGs.   Continue to hold Lexapro.  Monitor electrolytes.  Telemetry monitor.   Dementia-advanced.  Needing assistance with ADLs.  Continue donepezil, memantine, hold Lexapro for now.seen by speech therapy and recommended dysphagia 1 diet.  Currently n.p.o. for surgery.  Essential HTN We will continue to hold HCTZ.  Continue enalapril and Coreg.   Hypothyroidism Continue Synthroid   CKD 3a- cr 1.03  Stable.     DVT prophylaxis: SCDs Start: 03/12/21 2111   Code Status: Full code  Family Communication: None today.    Status is: Inpatient  Remains inpatient appropriate because:Ongoing active pain requiring inpatient pain management, Unsafe d/c plan, IV treatments appropriate due to intensity of illness or inability to take PO, Inpatient level of care appropriate due to severity of illness, and need for surgical intervention, electrolyte imbalance  Dispo: The patient is from: Home              Anticipated d/c is to: SNF              Patient currently is not medically stable to d/c.   Difficult to place patient No   Consultants: Orthopedics  Procedures: None  Anti-infectives:  Preop  Anti-infectives (From admission, onward)    Start     Dose/Rate Route Frequency Ordered Stop   03/13/21 0600  ceFAZolin (ANCEF) IVPB 2g/100 mL premix        2 g 200 mL/hr over 30 Minutes Intravenous On call to O.R. 03/12/21 1808 03/14/21 0559       Subjective: Today, patient was seen and examined at bedside and with underlying dementia.  Awake and mildly communicative.  Objective: Vitals:   03/14/21 1955 03/15/21  0322  BP: (!) 111/48 128/60  Pulse: 67 66  Resp: 19 18  Temp: 98 F (36.7 C) 98 F (36.7 C)  SpO2: 100% 99%    Intake/Output Summary (Last 24 hours) at 03/15/2021 1102 Last data filed at 03/14/2021 1700 Gross per 24 hour  Intake 120 ml  Output --  Net 120 ml    Filed Weights   03/12/21 1420  Weight: 67.6 kg   Body mass index is 22 kg/m.   Physical Exam:  General:   Average built, not in obvious distress, alert awake and mildly communicative, elderly female HENT:   No scleral pallor or icterus noted. Oral mucosa is moist.  Chest:  Clear breath sounds.  Diminished breath sounds bilaterally. No crackles or wheezes.  CVS: S1 &S2 heard. No murmur.  Regular rate and rhythm. Abdomen: Soft, nontender, nondistended.  Bowel sounds are heard.   Extremities: No cyanosis, clubbing or edema.  Peripheral pulses are palpable.  Right hip externally rotated. Psych: Alert, awake has underlying dementia. CNS:  No cranial nerve deficits.  Moves extremities, right lower extremity externally rotated. Skin: Warm and dry.  No rashes noted.  Data Review: I have personally reviewed the following laboratory data and studies,  CBC: Recent Labs  Lab 03/12/21 1441 03/13/21 0531  WBC 6.5 7.3  NEUTROABS 4.7  --   HGB 10.9* 10.2*  HCT 33.5* 31.0*  MCV 92.8 91.7  PLT 193 203    Basic Metabolic Panel: Recent Labs  Lab 03/12/21 1441 03/13/21 0531 03/13/21 1807 03/14/21 0415 03/14/21 1743  NA 124* 125* 123* 125* 124*  K 4.3 3.9 3.8 3.7 3.8  CL 90* 92* 88* 91* 90*  CO2 26 26 28 27 27   GLUCOSE 168* 192* 231* 180* 244*  BUN 14 14 15 17 22   CREATININE 1.11* 0.88 1.02* 1.08* 1.33*  CALCIUM 8.8* 8.7* 8.4* 8.7* 8.3*  MG 1.6*  --   --   --   --     Liver Function Tests: Recent Labs  Lab 03/12/21 1441  AST 26  ALT 19  ALKPHOS 72  BILITOT 0.8  PROT 6.4*  ALBUMIN 3.2*    No results for input(s): LIPASE, AMYLASE in the last 168 hours. No results for input(s): AMMONIA in the last 168 hours. Cardiac Enzymes: No results for input(s): CKTOTAL, CKMB, CKMBINDEX, TROPONINI in the last 168 hours. BNP (last 3 results) No results for input(s): BNP in the last 8760 hours.  ProBNP (last 3 results) No results for input(s): PROBNP in the last 8760 hours.  CBG: No results for input(s): GLUCAP in the last 168 hours. Recent Results (from the past 240 hour(s))  Resp Panel by  RT-PCR (Flu A&B, Covid) Nasopharyngeal Swab     Status: None   Collection Time: 03/12/21  2:57 PM   Specimen: Nasopharyngeal Swab; Nasopharyngeal(NP) swabs in vial transport medium  Result Value Ref Range Status   SARS Coronavirus 2 by RT PCR NEGATIVE NEGATIVE Final    Comment: (NOTE) SARS-CoV-2 target nucleic acids are NOT DETECTED.  The SARS-CoV-2 RNA is generally detectable in upper respiratory specimens during the acute phase of infection. The lowest concentration of SARS-CoV-2 viral copies this assay can detect is 138 copies/mL. A negative result does not preclude SARS-Cov-2 infection and should not be used as the sole basis for treatment or other patient management decisions. A negative result may occur with  improper specimen collection/handling, submission of specimen other than nasopharyngeal swab, presence of viral mutation(s) within the areas targeted by  this assay, and inadequate number of viral copies(<138 copies/mL). A negative result must be combined with clinical observations, patient history, and epidemiological information. The expected result is Negative.  Fact Sheet for Patients:  BloggerCourse.com  Fact Sheet for Healthcare Providers:  SeriousBroker.it  This test is no t yet approved or cleared by the [place] FDA and  has been authorized for detection and/or diagnosis of SARS-CoV-2 by FDA under an Emergency Use Authorization (EUA). This EUA will remain  in effect (meaning this test can be used) for the duration of the COVID-19 declaration under Section 564(b)(1) of the Act, 21 U.S.C.section 360bbb-3(b)(1), unless the authorization is terminated  or revoked sooner.       Influenza A by PCR NEGATIVE NEGATIVE Final   Influenza B by PCR NEGATIVE NEGATIVE Final    Comment: (NOTE) The Xpert Xpress SARS-CoV-2/FLU/RSV plus assay is intended as an aid in the diagnosis of influenza from Nasopharyngeal swab  specimens and should not be used as a sole basis for treatment. Nasal washings and aspirates are unacceptable for Xpert Xpress SARS-CoV-2/FLU/RSV testing.  Fact Sheet for Patients: BloggerCourse.com  Fact Sheet for Healthcare Providers: SeriousBroker.it  This test is not yet approved or cleared by the [place] FDA and has been authorized for detection and/or diagnosis of SARS-CoV-2 by FDA under an Emergency Use Authorization (EUA). This EUA will remain in effect (meaning this test can be used) for the duration of the COVID-19 declaration under Section 564(b)(1) of the Act, 21 U.S.C. section 360bbb-3(b)(1), unless the authorization is terminated or revoked.  Performed at [facility], [address] [place], [place] [address]   Surgical pcr screen     Status: None   Collection Time: 03/14/21  2:41 PM   Specimen: Nasal Mucosa; Nasal Swab  Result Value Ref Range Status   MRSA, PCR NEGATIVE NEGATIVE Final   Staphylococcus aureus NEGATIVE NEGATIVE Final    Comment: (NOTE) The Xpert SA Assay (FDA approved for NASAL specimens in patients 53 years of age and older), is one component of a comprehensive surveillance program. It is not intended to diagnose infection nor to guide or monitor treatment. Performed at [facility], [address] [place], [place] [address]       Studies: No results found.    Joycelyn Das, MD  [facility] 03/15/2021  If 7PM-7AM, please contact night-coverage

## 2021-03-15 NOTE — Anesthesia Postprocedure Evaluation (Signed)
Anesthesia Post Note  Patient: Latoya Perry  Procedure(s) Performed: INTRAMEDULLARY (IM) NAIL INTERTROCHANTRIC (Right: Hip)  Patient location during evaluation: PACU Anesthesia Type: General Level of consciousness: awake and alert Pain management: pain level controlled Vital Signs Assessment: post-procedure vital signs reviewed and stable Respiratory status: spontaneous breathing, nonlabored ventilation, respiratory function stable and patient connected to nasal cannula oxygen Cardiovascular status: blood pressure returned to baseline and stable Postop Assessment: no apparent nausea or vomiting Anesthetic complications: no   No notable events documented.   Last Vitals:  Vitals:   03/15/21 1232 03/15/21 1248  BP: 133/66 134/71  Pulse: 64 64  Resp: 16 17  Temp: 37 C 36.9 C  SpO2: 96% 99%    Last Pain:  Vitals:   03/15/21 1248  TempSrc: Oral  PainSc: 0-No pain                 Windell Norfolk

## 2021-03-15 NOTE — [place] Notes (Signed)
   ORTHOPAEDIC PROGRESS NOTE  Scheduled for Procedure(s): INTRAMEDULLARY (IM) NAIL INTERTROCHANTRIC  DOS: 03/15/2021  SUBJECTIVE: Some confusion, improved with her sister in the room.  Stable for surgery  OBJECTIVE: PE:  Comfortable.  Demented, does not respond appropriately  Hip in fixed position. Responds to light touch on foot Toes are warm and well perfused.   Vitals:   03/14/21 1955 03/15/21 0322  BP: (!) 111/48 128/60  Pulse: 67 66  Resp: 19 18  Temp: 98 F (36.7 C) 98 F (36.7 C)  SpO2: 100% 99%     ASSESSMENT: Latoya Perry is a 87 y.o. female stable, ready for surgery.  Sodium is chronically low, minimal change with attempted repletion.  Anesthesia and medicine comfortable proceeding with surgery.   PLAN: Weightbearing: NWB RLE Insicional and dressing care: Reinforce dressings as needed; none currently Orthopedic device(s): None VTE prophylaxis: to resume POD#1 Pain control: PO medications as needed; judicious use of narcotics Follow - up plan: 2 weeks after surgery     Contact information:     Navie Lamoreaux A. Dallas Schimke, MD MS [facility] [address] [place],  [place]  [address] Phone: [phone] Fax: [phone]

## 2021-03-15 NOTE — Op Note (Signed)
Orthopaedic Surgery Operative Note (CSN: [id number])  Latoya Perry  May 08, 1934 Date of Surgery: 03/12/2021 - 03/15/2021   Diagnoses:  Right intertrochanteric femur fracture  Procedure: Cephalomedullary nail for Right intertrochanteric femur fracture   Operative Finding Successful completion of the planned procedure.  Placement of nail 11 mm x 360 mm, 95 mm lag screw and 38 mm interlocking screw.  No complications.    Post-Op Diagnosis: Same Surgeons:Primary: Oliver Barre, MD Assistants:  None Location: [facility] OR ROOM 4 Anesthesia: General with local anesthesia Antibiotics: Ancef 2 g Tourniquet time: N/A Estimated Blood Loss:  150 cc Complications: None Specimens: None Implants: Implant Name Type Inv. Item Serial No. Manufacturer Lot No. LRB No. Used Action  SCREW LOCK LAG 10.5X95 GALILEO - [id number] Screw SCREW LOCK LAG 10.5X95 GALILEO  [employer] STERILE ON SET Right 1 Implanted  NAIL LONG ES 125D 11X36 - [id number] Nail NAIL LONG ES 125D 11X36  [employer] STERILE ON SET Right 1 Implanted  SCREW LOCK CORT 5.0X38 - [id number] Screw SCREW LOCK CORT 5.0X38  [employer] STERILE ON SET Right 1 Implanted    Indications for Surgery:   Latoya Perry is a 87 y.o. female who had a mechanical fall and sustained a Right intertrochanteric femur fracture.  I recommended operative fixation to restore stability and allow the patient to ambulate immediately postop.  Benefits and risks of operative and nonoperative management were discussed prior to surgery with the patient's guardian and informed consent form was completed.  Specific risks including infection, need for additional surgery, persistent pain, bleeding, malunion, nonunion and more severe complications associated with anesthesia.  The patient's guardian elected to proceed and surgical consent was obtained.    Procedure:   The patient was identified properly. Informed consent was obtained and the surgical site  was marked. The patient was taken to the OR where general anesthesia was induced.  The patient was placed supine on a fracture table and appropriate reduction was obtained and visualized on fluoroscopy prior to the beginning of the procedure.  Timeout was performed before the beginning of the case.  Ancef 2 g dosing was confirmed prior to making incision.  The patient received TXA prior to the start of surgery.   We made an incision proximal to the greater trochanter and dissected down through the fascia.  We then carefully placed a guidepin, localizing under fluoroscopy.  Once satisfied with the starting point, the entry reamer was used to gain entry into the intramedullary canal.  A ball tip guidewire was then introduced and passed to an appropriate level at the physeal scar of the distal femur and measurement was obtained proximally using fluoroscopy.  We selected the appropriate length of nail, as noted above.  Entry reamer was used needed but the nail was unreamed  At this point we placed our nail localizing under fluoroscopy, and confirmed that it was at the appropriate level.  Next we used the outrigger device to pass a wire into the femoral neck, and then the cephalomedullary lag screw.  The screw was locked proximally to avoid over collapse.  We then turned our attention to the distal interlocking screw.  Once again, we used the outrigger device to place a single interlocking screw in the midshaft area.  The outrigger device was removed and final fluoroscopic images were obtained.  The wounds were thoroughly irrigated closed in a multilayer fashion with 0 vicryl, 2-0 monocryl and staples.  Sterile dressings were placed.  The patient was awoken from general  anesthesia and taken to the PACU in stable condition without complication.     Post-operative plan:  Weightbearing: The patient will be WBAT on the operative extremity.   DVT prophylaxis per primary team, no orthopedic contraindications.   Recommend 81 mg Aspirin BID, unless patient cannot tolerate or was previously on anticoagulation.  Prefer to start Ppx POD#1 Pain control with PRN pain medication preferring oral medicines.   Dressing can be reinforced as needed, will change on POD#2/3 if needed.  Patient does not need to remain hospitalized for dressing change Follow up plan: approximately 2 weeks postop for incision check and XR.  If the patient will be returning to a nursing facility, staples can be removed around this time and a follow up appointment can be scheduled for 6 weeks after surgery. XR at next visit:  please obtain AP pelvis, and 2 views of the Right hip

## 2021-03-15 NOTE — [place] Notes (Signed)
SLP Cancellation Note  Patient Details Name: Latoya Perry MRN: [id number] DOB: [birth year]   Cancelled treatment:       Reason Eval/Treat Not Completed: Patient at procedure or test/unavailable. Pt is currently NPO per LPN pending procedure later today, therefore PO trials not provided. ST will continue to follow acutely and will provide diagnostic dysphagia therapy whenever appropriate, following procedure to determine LRD/ensure Pt is on appropriate diet. Thank you,  Latoya Perry, CCC-SLP Speech Language Pathologist    Latoya Perry 03/15/2021, 8:42 AM

## 2021-03-15 NOTE — Transfer of Care (Signed)
Immediate Anesthesia Transfer of Care Note  Patient: Latoya Perry  Procedure(s) Performed: INTRAMEDULLARY (IM) NAIL INTERTROCHANTRIC (Right: Hip)  Patient Location: PACU  Anesthesia Type:General  Level of Consciousness: drowsy  Airway & Oxygen Therapy: Patient Spontanous Breathing and Patient connected to face mask oxygen  Post-op Assessment: Report given to RN and Post -op Vital signs reviewed and stable  Post vital signs: Reviewed and stable  Last Vitals:  Vitals Value Taken Time  BP    Temp    Pulse    Resp    SpO2      Last Pain:  Vitals:   03/15/21 1248  TempSrc: Oral  PainSc: 0-No pain         Complications: No notable events documented.

## 2021-03-16 DIAGNOSIS — F0390 Unspecified dementia without behavioral disturbance: Secondary | ICD-10-CM | POA: Diagnosis not present

## 2021-03-16 DIAGNOSIS — E871 Hypo-osmolality and hyponatremia: Secondary | ICD-10-CM | POA: Diagnosis not present

## 2021-03-16 DIAGNOSIS — S72001A Fracture of unspecified part of neck of right femur, initial encounter for closed fracture: Secondary | ICD-10-CM | POA: Diagnosis not present

## 2021-03-16 NOTE — [place] Notes (Signed)
PT Cancellation Note  Patient Details Name: Latoya Perry MRN: [id number] DOB: October 14, 1933   Cancelled Treatment:    Reason Eval/Treat Not Completed: Patient declined, no reason specified Patient sitting up in bed and initially in pleasant mood. Alert to self only. Attempted to have patient actively move legs and then attempted passive motion. Patient refused all participation and became upset. Notified nursing. Will re-attempt again tomorrow.   10:34 AM, 03/16/21 Tereasa Coop, DPT Physical Therapy with [facility]  [phone] office

## 2021-03-16 NOTE — [place] Notes (Signed)
PT Cancellation Note  Patient Details Name: Latoya Perry MRN: [id number] DOB: 08-01-1934   Cancelled Treatment:    Reason Eval/Treat Not Completed: Patient declined, no reason specified Re-attempted physical therapy evaluation secondary to sister insisting that patient listens to her and now she is present to help. Continued to encourage patient to participate in therapy and asked patient to demonstrate left leg movement (non surgical leg). Patient refused and sister performed motion for patient despite patient saying "no." Patient became upset and session terminated secondary to patient refusal to participate in therapy.   11:49 AM, 03/16/21 Tereasa Coop, DPT Physical Therapy with [facility]  [phone] office

## 2021-03-16 NOTE — Progress Notes (Signed)
PROGRESS NOTE  Latoya Perry NTZ:[id number] DOB: Apr 08, 1934 DOA: 03/12/2021 PCP: Smith Robert, MD   LOS: 4 days   Brief narrative:  Latoya Perry is a 87 y.o. female with medical history significant for dementia, presented to the hospital after sustaining a fall.  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  Patient has advanced dementia and is unable to communicate accurately.  Needs assistance with all ADLs.  In the ED, patient was noted to have sodium of 124.  Head CT scan was unremarkable.  X-ray of the pelvis showed comminuted angulated intertrochanteric fracture of the proximal right femur.  Orthopedics was consulted and patient was admitted to hospital.   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  Orthopedics was consulted and patient underwent right intertrochanteric femur fracture IM nailing on 03/15/2021.  Acute urinary retention.  On Foley catheter.  We will consider for discontinuation in AM.  Hyponatremia-    Serum osmolality low.  Urine osmolality high but urine sodium low.  Serum cortisol and TSH within normal limits.  Has been started on salt tablets.  We will continue to monitor closely.  Sodium level of 128, slightly improved  Prolonged QTC-508.  No prior EKGs.  Continue to hold Lexapro.  Monitor electrolytes.  Telemetry monitor.   Dementia-advanced.  Needing assistance with ADLs.  Continue donepezil, memantine, will continue to hold Lexapro for now. Seen by speech therapy and recommended dysphagia 1 diet.    Essential HTN We will continue to hold HCTZ.  Continue enalapril and Coreg.   Hypothyroidism Continue Synthroid    CKD 3a- cr 1.03  Stable.     DVT prophylaxis: SCDs Start: 03/12/21 2111   Code Status: Full code  Family Communication: None today.    Status is: Inpatient  Remains inpatient appropriate because:Ongoing active pain requiring inpatient pain management, Unsafe d/c plan, IV treatments appropriate due to intensity of illness or inability to take PO, Inpatient level of care appropriate due to severity of illness, and need for surgical intervention, electrolyte imbalance  Dispo: The patient is from: Home              Anticipated d/c is to: SNF              Patient currently is not medically stable to d/c.   Difficult to place patient No   Consultants: Orthopedics  Procedures: None  Anti-infectives:  Preop  Anti-infectives (From admission, onward)    Start     Dose/Rate Route Frequency Ordered Stop   03/16/21 0600  ceFAZolin (ANCEF) IVPB 2g/100 mL premix  Status:  Discontinued        2 g 200 mL/hr over 30 Minutes Intravenous On call to O.R. 03/15/21 1421 03/15/21 1436   03/15/21 2300  ceFAZolin (ANCEF) IVPB 2g/100 mL premix        2 g 200 mL/hr over 30 Minutes Intravenous Every 8 hours 03/15/21 1804 03/16/21 2259   03/15/21 1445  ceFAZolin (ANCEF) IVPB 2g/100 mL premix        2 g 200 mL/hr over 30 Minutes Intravenous On call to O.R. 03/15/21 1420 03/15/21 1522   03/15/21 1423  ceFAZolin (ANCEF) 2-4 GM/100ML-% IVPB       Note to  Pharmacy: Devoria GlassingMoore, Kimberly   : cabinet override      03/15/21 1423 03/15/21 1525   03/13/21 0600  ceFAZolin (ANCEF) IVPB 2g/100 mL premix        2 g 200 mL/hr over 30 Minutes Intravenous On call to O.R. 03/12/21 1808 03/14/21 0559       Subjective: Today, patient was seen and examined at bedside.  Patient has advanced dementia denies interval complaints  Objective: Vitals:   03/15/21 1755 03/16/21 0519  BP: (!) 157/77 (!) 122/54  Pulse: 65 70  Resp: 18 19  Temp: 98.1 F (36.7 C) 98.2 F (36.8 C)  SpO2: 100% 100%    Intake/Output  Summary (Last 24 hours) at 03/16/2021 1238 Last data filed at 03/16/2021 0900 Gross per 24 hour  Intake 1500 ml  Output 660 ml  Net 840 ml    Filed Weights   03/12/21 1420  Weight: 67.6 kg   Body mass index is 22 kg/m.   Physical Exam:  General:  Average built, not in obvious distress, alert awake and mildly communicative, HENT:   No scleral pallor or icterus noted. Oral mucosa is moist.  Chest:  Clear breath sounds.  Diminished breath sounds bilaterally. No crackles or wheezes.  CVS: S1 &S2 heard. No murmur.  Regular rate and rhythm. Abdomen: Soft, nontender, nondistended.  Bowel sounds are heard.   Extremities: No cyanosis, clubbing or edema.  Peripheral pulses are palpable.  Status post right hip surgery. Psych: Alert, awake and communicative, disoriented, has underlying dementia, normal mood CNS:  No cranial nerve deficits.  Power equal in all extremities.   Skin: Warm and dry.  No rashes noted.   Data Review: I have personally reviewed the following laboratory data and studies,  CBC: Recent Labs  Lab 03/12/21 1441 03/13/21 0531  WBC 6.5 7.3  NEUTROABS 4.7  --   HGB 10.9* 10.2*  HCT 33.5* 31.0*  MCV 92.8 91.7  PLT 193 203    Basic Metabolic Panel: Recent Labs  Lab 03/12/21 1441 03/13/21 0531 03/13/21 1807 03/14/21 0415 03/14/21 1743 03/15/21 1809  NA 124* 125* 123* 125* 124* 128*  K 4.3 3.9 3.8 3.7 3.8 4.2  CL 90* 92* 88* 91* 90* 93*  CO2 26 26 28 27 27 30   GLUCOSE 168* 192* 231* 180* 244* 204*  BUN 14 14 15 17 22 22   CREATININE 1.11* 0.88 1.02* 1.08* 1.33* 1.20*  CALCIUM 8.8* 8.7* 8.4* 8.7* 8.3* 8.4*  MG 1.6*  --   --   --   --   --     Liver Function Tests: Recent Labs  Lab 03/12/21 1441  AST 26  ALT 19  ALKPHOS 72  BILITOT 0.8  PROT 6.4*  ALBUMIN 3.2*    No results for input(s): LIPASE, AMYLASE in the last 168 hours. No results for input(s): AMMONIA in the last 168 hours. Cardiac Enzymes: No results for input(s): CKTOTAL, CKMB,  CKMBINDEX, TROPONINI in the last 168 hours. BNP (last 3 results) No results for input(s): BNP in the last 8760 hours.  ProBNP (last 3 results) No results for input(s): PROBNP in the last 8760 hours.  CBG: No results for input(s): GLUCAP in the last 168 hours. Recent Results (from the past 240 hour(s))  Resp Panel by RT-PCR (Flu A&B, Covid) Nasopharyngeal Swab     Status: None   Collection Time: 03/12/21  2:57 PM   Specimen: Nasopharyngeal Swab; Nasopharyngeal(NP) swabs in vial transport medium  Result Value Ref Range Status  SARS Coronavirus 2 by RT PCR NEGATIVE NEGATIVE Final    Comment: (NOTE) SARS-CoV-2 target nucleic acids are NOT DETECTED.  The SARS-CoV-2 RNA is generally detectable in upper respiratory specimens during the acute phase of infection. The lowest concentration of SARS-CoV-2 viral copies this assay can detect is 138 copies/mL. A negative result does not preclude SARS-Cov-2 infection and should not be used as the sole basis for treatment or other patient management decisions. A negative result may occur with  improper specimen collection/handling, submission of specimen other than nasopharyngeal swab, presence of viral mutation(s) within the areas targeted by this assay, and inadequate number of viral copies(<138 copies/mL). A negative result must be combined with clinical observations, patient history, and epidemiological information. The expected result is Negative.  Fact Sheet for Patients:  BloggerCourse.com  Fact Sheet for Healthcare Providers:  SeriousBroker.it  This test is no t yet approved or cleared by the [place] FDA and  has been authorized for detection and/or diagnosis of SARS-CoV-2 by FDA under an Emergency Use Authorization (EUA). This EUA will remain  in effect (meaning this test can be used) for the duration of the COVID-19 declaration under Section 564(b)(1) of the Act,  21 U.S.C.section 360bbb-3(b)(1), unless the authorization is terminated  or revoked sooner.       Influenza A by PCR NEGATIVE NEGATIVE Final   Influenza B by PCR NEGATIVE NEGATIVE Final    Comment: (NOTE) The Xpert Xpress SARS-CoV-2/FLU/RSV plus assay is intended as an aid in the diagnosis of influenza from Nasopharyngeal swab specimens and should not be used as a sole basis for treatment. Nasal washings and aspirates are unacceptable for Xpert Xpress SARS-CoV-2/FLU/RSV testing.  Fact Sheet for Patients: BloggerCourse.com  Fact Sheet for Healthcare Providers: SeriousBroker.it  This test is not yet approved or cleared by the [place] FDA and has been authorized for detection and/or diagnosis of SARS-CoV-2 by FDA under an Emergency Use Authorization (EUA). This EUA will remain in effect (meaning this test can be used) for the duration of the COVID-19 declaration under Section 564(b)(1) of the Act, 21 U.S.C. section 360bbb-3(b)(1), unless the authorization is terminated or revoked.  Performed at [facility], [address] [place], [place] [address]   Surgical pcr screen     Status: None   Collection Time: 03/14/21  2:41 PM   Specimen: Nasal Mucosa; Nasal Swab  Result Value Ref Range Status   MRSA, PCR NEGATIVE NEGATIVE Final   Staphylococcus aureus NEGATIVE NEGATIVE Final    Comment: (NOTE) The Xpert SA Assay (FDA approved for NASAL specimens in patients 25 years of age and older), is one component of a comprehensive surveillance program. It is not intended to diagnose infection nor to guide or monitor treatment. Performed at [facility], [address], [place], [place] [address]       Studies: DG HIP OPERATIVE UNILAT W OR W/O PELVIS RIGHT  Result Date: 03/15/2021 CLINICAL DATA:  Internal fixation EXAM: OPERATIVE RIGHT HIP (WITH PELVIS IF PERFORMED) 8 VIEWS TECHNIQUE: Fluoroscopic spot image(s) were submitted for  interpretation post-operatively. COMPARISON:  03/12/2021 FINDINGS: Multiple intraoperative spot images demonstrate internal fixation across the right femoral intertrochanteric region with dynamic hip screw and intramedullary nail. No hardware or bony complicating feature. IMPRESSION: Internal fixation.  No visible complicating feature. Electronically Signed   By: Charlett Nose M.D.   On: 03/15/2021 17:58   DG FEMUR PORT, MIN 2 VIEWS RIGHT  Result Date: 03/15/2021 CLINICAL DATA:  Postop right femur, ORIF EXAM: RIGHT FEMUR  PORTABLE 2 VIEW COMPARISON:  None. FINDINGS: Internal fixation across the right femoral intertrochanteric fracture. Mildly displaced lesser trochanter. Otherwise anatomic alignment. No hardware or bony complicating feature. IMPRESSION: Internal fixation as above.  No complicating feature. Electronically Signed   By: Charlett Nose M.D.   On: 03/15/2021 17:58      Joycelyn Das, MD  [facility] 03/16/2021  If 7PM-7AM, please contact night-coverage

## 2021-03-16 NOTE — Progress Notes (Signed)
Subjective: 1 Day Post-Op Procedure(s) (LRB): INTRAMEDULLARY (IM) NAIL INTERTROCHANTRIC (Right) Patient reports pain as mild.    Objective: Vital signs in last 24 hours: Temp:  [97.9 F (36.6 C)-98.6 F (37 C)] 98.2 F (36.8 C) (07/30 0519) Pulse Rate:  [62-70] 70 (07/30 0519) Resp:  [14-19] 19 (07/30 0519) BP: (117-182)/(54-77) 122/54 (07/30 0519) SpO2:  [96 %-100 %] 100 % (07/30 0519)  Intake/Output from previous day: 07/29 0701 - 07/30 0700 In: 1500 [I.V.:1200; IV Piggyback:300] Out: 660 [Urine:510; Blood:150] Intake/Output this shift: No intake/output data recorded.  No results for input(s): HGB in the last 72 hours. No results for input(s): WBC, RBC, HCT, PLT in the last 72 hours. Recent Labs    03/14/21 1743 03/15/21 1809  NA 124* 128*  K 3.8 4.2  CL 90* 93*  CO2 27 30  BUN 22 22  CREATININE 1.33* 1.20*  GLUCOSE 244* 204*  CALCIUM 8.3* 8.4*   No results for input(s): LABPT, INR in the last 72 hours.  Neurologically intact Neurovascular intact Sensation intact distally Intact pulses distally Dorsiflexion/Plantar flexion intact Incision: dressing C/D/I Compartment soft   Assessment/Plan: 1 Day Post-Op Procedure(s) (LRB): INTRAMEDULLARY (IM) NAIL INTERTROCHANTRIC (Right) Advance diet Up with therapy Discharge to SNF when bed available      Latoya Perry 03/16/2021, 8:46 AM

## 2021-03-16 NOTE — Progress Notes (Addendum)
Has been combative off and on today.  Sometimes cooperative with care.  Has swallowed with no difficulty .  Very resistive to any pericare .  Sister at beside today for several hours.  Dressing to right hip dry and intact with small amt of old drainage

## 2021-03-17 DIAGNOSIS — S72001A Fracture of unspecified part of neck of right femur, initial encounter for closed fracture: Secondary | ICD-10-CM | POA: Diagnosis not present

## 2021-03-17 DIAGNOSIS — F0390 Unspecified dementia without behavioral disturbance: Secondary | ICD-10-CM | POA: Diagnosis not present

## 2021-03-17 DIAGNOSIS — E871 Hypo-osmolality and hyponatremia: Secondary | ICD-10-CM | POA: Diagnosis not present

## 2021-03-17 LAB — COMPREHENSIVE METABOLIC PANEL
ALT: 14 U/L (ref 0–44)
AST: 36 U/L (ref 15–41)
Albumin: 2.8 g/dL — ABNORMAL LOW (ref 3.5–5.0)
Alkaline Phosphatase: 51 U/L (ref 38–126)
Anion gap: 10 (ref 5–15)
BUN: 30 mg/dL — ABNORMAL HIGH (ref 8–23)
CO2: 27 mmol/L (ref 22–32)
Calcium: 8.8 mg/dL — ABNORMAL LOW (ref 8.9–10.3)
Chloride: 95 mmol/L — ABNORMAL LOW (ref 98–111)
Creatinine, Ser: 1.13 mg/dL — ABNORMAL HIGH (ref 0.44–1.00)
GFR, Estimated: 48 mL/min — ABNORMAL LOW (ref >60)
Glucose, Bld: 169 mg/dL — ABNORMAL HIGH (ref 70–99)
Potassium: 4.2 mmol/L (ref 3.5–5.1)
Sodium: 132 mmol/L — ABNORMAL LOW (ref 135–145)
Total Bilirubin: 1.1 mg/dL (ref 0.3–1.2)
Total Protein: 6.1 g/dL — ABNORMAL LOW (ref 6.5–8.1)

## 2021-03-17 LAB — CBC
HCT: 23.9 % — ABNORMAL LOW (ref 36.0–46.0)
Hemoglobin: 7.6 g/dL — ABNORMAL LOW (ref 12.0–15.0)
MCH: 29.9 pg (ref 26.0–34.0)
MCHC: 31.8 g/dL (ref 30.0–36.0)
MCV: 94.1 fL (ref 80.0–100.0)
Platelets: 188 10*3/uL (ref 150–400)
RBC: 2.54 MIL/uL — ABNORMAL LOW (ref 3.87–5.11)
RDW: 12.6 % (ref 11.5–15.5)
WBC: 9.5 10*3/uL (ref 4.0–10.5)
nRBC: 0 % (ref 0.0–0.2)

## 2021-03-17 LAB — MAGNESIUM: Magnesium: 1.8 mg/dL (ref 1.7–2.4)

## 2021-03-17 LAB — PHOSPHORUS: Phosphorus: 2.8 mg/dL (ref 2.5–4.6)

## 2021-03-17 MED ORDER — ORAL CARE MOUTH RINSE
15.0000 mL | Freq: Two times a day (BID) | OROMUCOSAL | Status: DC
  Administered 2021-03-17 – 2021-03-18 (×4): 15 mL via OROMUCOSAL

## 2021-03-17 MED ORDER — SODIUM CHLORIDE 1 G PO TABS
1.0000 g | ORAL_TABLET | Freq: Two times a day (BID) | ORAL | Status: DC
  Administered 2021-03-17 – 2021-03-18 (×2): 1 g via ORAL
  Filled 2021-03-17: qty 1

## 2021-03-17 NOTE — Progress Notes (Signed)
PT Cancellation Note  Patient Details Name: Latoya Perry MRN: [id number] DOB: 10-22-1933   Cancelled Treatment:    Reason Eval/Treat Not Completed: Patient declined, no reason specified Attempted evaluation again on this date. Patient refused as everything is painful on both legs. Instructed patient to perform ankle pumps in bed which patient performed but immediately reported that she had pain in both legs and stopped, refused care and participation in any other movement. Nurse tech notified.   10:14 AM, 03/17/21 Tereasa Coop, DPT Physical Therapy with [facility]  [phone] office

## 2021-03-17 NOTE — Progress Notes (Addendum)
Patient unable to void since foley removal at 1030. Bladder scan showed . In and out cath performed per Dr. Tyson Babinski and clear yellow urine came out.

## 2021-03-17 NOTE — Progress Notes (Addendum)
PROGRESS NOTE  Latoya Perry [id number]:[id number] DOB: 08-29-1933 DOA: 03/12/2021 PCP: Smith Robert, MD   LOS: 5 days   Brief narrative:  Latoya Perry is a 87 y.o. female with medical history significant for dementia, presented to the hospital after sustaining a fall.  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  Patient has advanced dementia and is unable to communicate accurately.  Needs assistance with all ADLs.  In the ED, patient was noted to have sodium of 124.  Head CT scan was unremarkable.  X-ray of the pelvis showed comminuted angulated intertrochanteric fracture of the proximal right femur.  Orthopedics was consulted and patient was admitted to hospital.   During hospitalization, patient had hyponatremia with sodium slightly improved but patient was asymptomatic.  She subsequently underwent hip surgery on 03/15/2021 by orthopedics.  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  Orthopedics was consulted and patient underwent right intertrochanteric femur fracture IM nailing on 03/15/2021.  Postoperative course was unremarkable.  Acute urinary retention.  On Foley catheter.  Will discontinue and do a voiding trial.  Hyponatremia-    Serum osmolality low.  Urine osmolality high but urine sodium low.  Serum cortisol and TSH within normal limits.  Has been started on salt tablets with improvement in sodium levels.  Clinically volume seems to be stable.  Prolonged QTC-508.  No prior EKGs.  Continue to hold Lexapro.  Monitor electrolytes.  Telemetry monitor.   Dementia-advanced.  Needing assistance with ADLs.  Continue donepezil, memantine, will  continue to hold Lexapro for now. Seen by speech therapy and recommended dysphagia 1 diet.    Essential HTN We will continue to hold HCTZ.  Will likely need to discontinue on discharge.  Continue enalapril and Coreg.   Hypothyroidism Continue Synthroid   CKD 3a- cr 1.03  Stable.     DVT prophylaxis: SCDs Start: 03/12/21 2111   Code Status: Full code  Family Communication: None today.    Status is: Inpatient  Remains inpatient appropriate because:Ongoing active pain requiring inpatient pain management, Unsafe d/c plan, IV treatments appropriate due to intensity of illness or inability to take PO, Inpatient level of care appropriate due to severity of illness,   Dispo: The patient is from: Home              Anticipated d/c is to: SNF              Patient currently is  medically stable to d/c.   Difficult to place patient No   Consultants: Orthopedics  Procedures: Right intertrochanteric femur fracture status post IM nailing on 03/15/2021  Anti-infectives:  Preop  Anti-infectives (From admission, onward)    Start     Dose/Rate Route Frequency Ordered Stop   03/16/21 0600  ceFAZolin (ANCEF) IVPB 2g/100 mL premix  Status:  Discontinued        2 g 200 mL/hr over 30 Minutes Intravenous On call to O.R. 03/15/21 1421 03/15/21 1436   03/15/21 2300  ceFAZolin (ANCEF) IVPB 2g/100 mL premix        2 g 200 mL/hr over 30 Minutes Intravenous Every 8 hours 03/15/21 1804 03/16/21 1718   03/15/21 1445  ceFAZolin (ANCEF) IVPB 2g/100 mL premix  2 g 200 mL/hr over 30 Minutes Intravenous On call to O.R. 03/15/21 1420 03/15/21 1522   03/15/21 1423  ceFAZolin (ANCEF) 2-4 GM/100ML-% IVPB       Note to Pharmacy: Devoria GlassingMoore, Kimberly   : cabinet override      03/15/21 1423 03/15/21 1525   03/13/21 0600  ceFAZolin (ANCEF) IVPB 2g/100 mL premix        2 g 200 mL/hr over 30 Minutes Intravenous On call to O.R. 03/12/21 1808 03/14/21 0559       Subjective: Today, patient was seen and  examined at bedside.  Denies any interval complaints.  Has advanced dementia and is poor historian.  Denies overt pain.  Objective: Vitals:   03/16/21 2009 03/16/21 2046  BP:  (!) 150/92  Pulse:  82  Resp:    Temp:    SpO2: 98%     Intake/Output Summary (Last 24 hours) at 03/17/2021 0901 Last data filed at 03/17/2021 0826 Gross per 24 hour  Intake 680 ml  Output 1700 ml  Net -1020 ml    Filed Weights   03/12/21 1420  Weight: 67.6 kg   Body mass index is 22 kg/m.   Physical Exam:  General:  Average built, not in obvious distress, alert awake and mildly communicative, has underlying dementia HENT:   No scleral pallor or icterus noted. Oral mucosa is moist.  Chest:  Clear breath sounds.  Diminished breath sounds bilaterally. No crackles or wheezes.  CVS: S1 &S2 heard. No murmur.  Regular rate and rhythm. Abdomen: Soft, nontender, nondistended.  Bowel sounds are heard.   Extremities: No cyanosis, clubbing or edema.  Peripheral pulses are palpable.  Status post right hip surgery with dressing. Psych: Alert, awake and communicative, has underlying dementia, communicative, disoriented  CNS:  No cranial nerve deficits Skin: Warm and dry.  No rashes noted.   Data Review: I have personally reviewed the following laboratory data and studies,  CBC: Recent Labs  Lab 03/12/21 1441 03/13/21 0531 03/17/21 0556  WBC 6.5 7.3 9.5  NEUTROABS 4.7  --   --   HGB 10.9* 10.2* 7.6*  HCT 33.5* 31.0* 23.9*  MCV 92.8 91.7 94.1  PLT 193 203 188    Basic Metabolic Panel: Recent Labs  Lab 03/12/21 1441 03/13/21 0531 03/13/21 1807 03/14/21 0415 03/14/21 1743 03/15/21 1809 03/17/21 0556  NA 124*   < > 123* 125* 124* 128* 132*  K 4.3   < > 3.8 3.7 3.8 4.2 4.2  CL 90*   < > 88* 91* 90* 93* 95*  CO2 26   < > 28 27 27 30 27   GLUCOSE 168*   < > 231* 180* 244* 204* 169*  BUN 14   < > 15 17 22 22  30*  CREATININE 1.11*   < > 1.02* 1.08* 1.33* 1.20* 1.13*  CALCIUM 8.8*   < > 8.4* 8.7*  8.3* 8.4* 8.8*  MG 1.6*  --   --   --   --   --  1.8  PHOS  --   --   --   --   --   --  2.8   < > = values in this interval not displayed.    Liver Function Tests: Recent Labs  Lab 03/12/21 1441 03/17/21 0556  AST 26 36  ALT 19 14  ALKPHOS 72 51  BILITOT 0.8 1.1  PROT 6.4* 6.1*  ALBUMIN 3.2* 2.8*    No results for input(s): LIPASE, AMYLASE in the last  168 hours. No results for input(s): AMMONIA in the last 168 hours. Cardiac Enzymes: No results for input(s): CKTOTAL, CKMB, CKMBINDEX, TROPONINI in the last 168 hours. BNP (last 3 results) No results for input(s): BNP in the last 8760 hours.  ProBNP (last 3 results) No results for input(s): PROBNP in the last 8760 hours.  CBG: No results for input(s): GLUCAP in the last 168 hours. Recent Results (from the past 240 hour(s))  Resp Panel by RT-PCR (Flu A&B, Covid) Nasopharyngeal Swab     Status: None   Collection Time: 03/12/21  2:57 PM   Specimen: Nasopharyngeal Swab; Nasopharyngeal(NP) swabs in vial transport medium  Result Value Ref Range Status   SARS Coronavirus 2 by RT PCR NEGATIVE NEGATIVE Final    Comment: (NOTE) SARS-CoV-2 target nucleic acids are NOT DETECTED.  The SARS-CoV-2 RNA is generally detectable in upper respiratory specimens during the acute phase of infection. The lowest concentration of SARS-CoV-2 viral copies this assay can detect is 138 copies/mL. A negative result does not preclude SARS-Cov-2 infection and should not be used as the sole basis for treatment or other patient management decisions. A negative result may occur with  improper specimen collection/handling, submission of specimen other than nasopharyngeal swab, presence of viral mutation(s) within the areas targeted by this assay, and inadequate number of viral copies(<138 copies/mL). A negative result must be combined with clinical observations, patient history, and epidemiological information. The expected result is Negative.  Fact  Sheet for Patients:  BloggerCourse.com  Fact Sheet for Healthcare Providers:  SeriousBroker.it  This test is no t yet approved or cleared by the [place] FDA and  has been authorized for detection and/or diagnosis of SARS-CoV-2 by FDA under an Emergency Use Authorization (EUA). This EUA will remain  in effect (meaning this test can be used) for the duration of the COVID-19 declaration under Section 564(b)(1) of the Act, 21 U.S.C.section 360bbb-3(b)(1), unless the authorization is terminated  or revoked sooner.       Influenza A by PCR NEGATIVE NEGATIVE Final   Influenza B by PCR NEGATIVE NEGATIVE Final    Comment: (NOTE) The Xpert Xpress SARS-CoV-2/FLU/RSV plus assay is intended as an aid in the diagnosis of influenza from Nasopharyngeal swab specimens and should not be used as a sole basis for treatment. Nasal washings and aspirates are unacceptable for Xpert Xpress SARS-CoV-2/FLU/RSV testing.  Fact Sheet for Patients: BloggerCourse.com  Fact Sheet for Healthcare Providers: SeriousBroker.it  This test is not yet approved or cleared by the [place] FDA and has been authorized for detection and/or diagnosis of SARS-CoV-2 by FDA under an Emergency Use Authorization (EUA). This EUA will remain in effect (meaning this test can be used) for the duration of the COVID-19 declaration under Section 564(b)(1) of the Act, 21 U.S.C. section 360bbb-3(b)(1), unless the authorization is terminated or revoked.  Performed at [facility], [address], [place], [place] [address]   Surgical pcr screen     Status: None   Collection Time: 03/14/21  2:41 PM   Specimen: Nasal Mucosa; Nasal Swab  Result Value Ref Range Status   MRSA, PCR NEGATIVE NEGATIVE Final   Staphylococcus aureus NEGATIVE NEGATIVE Final    Comment: (NOTE) The Xpert SA Assay (FDA approved for NASAL specimens in  patients 39 years of age and older), is one component of a comprehensive surveillance program. It is not intended to diagnose infection nor to guide or monitor treatment. Performed at [facility], [address] [place], [place] [address]  Studies: DG HIP OPERATIVE UNILAT W OR W/O PELVIS RIGHT  Result Date: 03/15/2021 CLINICAL DATA:  Internal fixation EXAM: OPERATIVE RIGHT HIP (WITH PELVIS IF PERFORMED) 8 VIEWS TECHNIQUE: Fluoroscopic spot image(s) were submitted for interpretation post-operatively. COMPARISON:  03/12/2021 FINDINGS: Multiple intraoperative spot images demonstrate internal fixation across the right femoral intertrochanteric region with dynamic hip screw and intramedullary nail. No hardware or bony complicating feature. IMPRESSION: Internal fixation.  No visible complicating feature. Electronically Signed   By: Charlett Nose M.D.   On: 03/15/2021 17:58   DG FEMUR PORT, MIN 2 VIEWS RIGHT  Result Date: 03/15/2021 CLINICAL DATA:  Postop right femur, ORIF EXAM: RIGHT FEMUR PORTABLE 2 VIEW COMPARISON:  None. FINDINGS: Internal fixation across the right femoral intertrochanteric fracture. Mildly displaced lesser trochanter. Otherwise anatomic alignment. No hardware or bony complicating feature. IMPRESSION: Internal fixation as above.  No complicating feature. Electronically Signed   By: Charlett Nose M.D.   On: 03/15/2021 17:58      Joycelyn Das, MD  [facility] 03/17/2021  If 7PM-7AM, please contact night-coverage

## 2021-03-17 NOTE — Progress Notes (Signed)
Subjective: 2 Days Post-Op Procedure(s) (LRB): INTRAMEDULLARY (IM) NAIL INTERTROCHANTRIC (Right) Patient reports pain as  pleasantly demented .    Objective: Vital signs in last 24 hours: Pulse Rate:  [72-82] 82 (07/30 2046) Resp:  [18] 18 (07/30 1418) BP: (119-150)/(61-92) 150/92 (07/30 2046) SpO2:  [98 %] 98 % (07/30 2009)  Intake/Output from previous day: 07/30 0701 - 07/31 0700 In: 560 [P.O.:360; IV Piggyback:200] Out: 1700 [Urine:1700] Intake/Output this shift: Total I/O In: 120 [P.O.:120] Out: -   Recent Labs    03/17/21 0556  HGB 7.6*   Recent Labs    03/17/21 0556  WBC 9.5  RBC 2.54*  HCT 23.9*  PLT 188   Recent Labs    03/15/21 1809 03/17/21 0556  NA 128* 132*  K 4.2 4.2  CL 93* 95*  CO2 30 27  BUN 22 30*  CREATININE 1.20* 1.13*  GLUCOSE 204* 169*  CALCIUM 8.4* 8.8*   No results for input(s): LABPT, INR in the last 72 hours.  Neurovascular intact Sensation intact distally Intact pulses distally Incision: dressing C/D/I Compartment soft   Assessment/Plan: 2 Days Post-Op Procedure(s) (LRB): INTRAMEDULLARY (IM) NAIL INTERTROCHANTRIC (Right)  Hg is low, but thigh shows no unusual swelling, suspect due to hydration and surgery plays a small role.  If pulse and bp stable no transfusion necessary   D/C when stable    Fuller Canada 03/17/2021, 10:56 AM

## 2021-03-18 ENCOUNTER — Encounter (HOSPITAL_COMMUNITY): Payer: Self-pay | Admitting: Orthopedic Surgery

## 2021-03-18 DIAGNOSIS — S72401K Unspecified fracture of lower end of right femur, subsequent encounter for closed fracture with nonunion: Secondary | ICD-10-CM | POA: Diagnosis not present

## 2021-03-18 DIAGNOSIS — E871 Hypo-osmolality and hyponatremia: Secondary | ICD-10-CM | POA: Diagnosis not present

## 2021-03-18 DIAGNOSIS — S72001A Fracture of unspecified part of neck of right femur, initial encounter for closed fracture: Secondary | ICD-10-CM | POA: Diagnosis not present

## 2021-03-18 DIAGNOSIS — F0390 Unspecified dementia without behavioral disturbance: Secondary | ICD-10-CM | POA: Diagnosis not present

## 2021-03-18 LAB — BASIC METABOLIC PANEL
Anion gap: 6 (ref 5–15)
BUN: 30 mg/dL — ABNORMAL HIGH (ref 8–23)
CO2: 30 mmol/L (ref 22–32)
Calcium: 8.3 mg/dL — ABNORMAL LOW (ref 8.9–10.3)
Chloride: 98 mmol/L (ref 98–111)
Creatinine, Ser: 1.02 mg/dL — ABNORMAL HIGH (ref 0.44–1.00)
GFR, Estimated: 54 mL/min — ABNORMAL LOW (ref >60)
Glucose, Bld: 148 mg/dL — ABNORMAL HIGH (ref 70–99)
Potassium: 4 mmol/L (ref 3.5–5.1)
Sodium: 134 mmol/L — ABNORMAL LOW (ref 135–145)

## 2021-03-18 LAB — CBC
HCT: 19.7 % — ABNORMAL LOW (ref 36.0–46.0)
Hemoglobin: 6.4 g/dL — CL (ref 12.0–15.0)
MCH: 30.2 pg (ref 26.0–34.0)
MCHC: 32.5 g/dL (ref 30.0–36.0)
MCV: 92.9 fL (ref 80.0–100.0)
Platelets: 180 10*3/uL (ref 150–400)
RBC: 2.12 MIL/uL — ABNORMAL LOW (ref 3.87–5.11)
RDW: 13 % (ref 11.5–15.5)
WBC: 7.4 10*3/uL (ref 4.0–10.5)
nRBC: 0 % (ref 0.0–0.2)

## 2021-03-18 LAB — PREPARE RBC (CROSSMATCH)

## 2021-03-18 LAB — ABO/RH: ABO/RH(D): O POS

## 2021-03-18 MED ORDER — SODIUM CHLORIDE 0.9% IV SOLUTION: Freq: Once | INTRAVENOUS | Status: AC

## 2021-03-18 NOTE — Progress Notes (Signed)
  Speech Language Pathology Treatment: Dysphagia  Patient Details Name: Latoya Perry MRN: [id number] DOB: Oct 20, 1933 Today's Date: 03/18/2021 Time: 1173-5670 SLP Time Calculation (min) (ACUTE ONLY): 20 min  Assessment / Plan / Recommendation Clinical Impression  Pt seen for ongoing dysphagia intervention following hip fracture repair on Friday and clinical swallow evaluation completed the previous Wednesday. Pt was being fed lunch meal of puree and thin liquids via straw sips. Pt persists with cognitive based dysphagia characterized by oral holding and verbal and tactile cues required at times to encourage Pt to swallow. Pt does not show signs of reduced airway protection, no coughing or throat clearing and vocal quality remains clear. Pt is doing well on her baseline diet of D1/puree and thin liquids with meds crushed in puree and should continue post discharge to SNF. SLP will sign off. Pt's sister is in agreement with plan of care.    HPI HPI: Latoya Perry is a 87 y.o. female with medical history significant for dementia, presented to hospital after sustaining a fall.  Patient does normally have a Actuary.  Patient has advanced dementia and is unable to communicate accurately.  Needs assistance with all ADLs.  In the ED patient was noted to have sodium of 124.  Head CT scan was unremarkable.  X-ray of the pelvis showed comminuted angulated intertrochanteric fracture of the proximal right femur.  Orthopedics was consulted and patient was admitted to hospital.      SLP Plan  All goals met;Discharge SLP treatment due to (comment)       Recommendations  Diet recommendations: Dysphagia 1 (puree);Thin liquid Liquids provided via: Cup;Straw Medication Administration: Crushed with puree Supervision: Staff to assist with self feeding;Full supervision/cueing for compensatory strategies Compensations: Small sips/bites;Follow solids with liquid Postural Changes and/or Swallow Maneuvers:  Seated upright 90 degrees;Upright 30-60 min after meal                Oral Care Recommendations: Oral care BID;Staff/trained caregiver to provide oral care Follow up Recommendations: Skilled Nursing facility;24 hour supervision/assistance SLP Visit Diagnosis: Dysphagia, unspecified (R13.10) Plan: All goals met;Discharge SLP treatment due to (comment)       Thank you,  Genene Churn, [place]                 [place] 03/18/2021, 1:42 PM

## 2021-03-18 NOTE — Progress Notes (Addendum)
PROGRESS NOTE  Latoya DolphinMattie Perry ZOX:[id number] DOB: Nov 22, 1933 DOA: 03/12/2021 PCP: Smith RobertKikel, Stephen, MD   LOS: 6 days   Brief narrative:  Latoya DolphinMattie Perry is a 87 y.o. female with medical history significant for dementia, presented to the hospital after sustaining a fall.  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  Patient has advanced dementia and is unable to communicate accurately.  Needs assistance with all ADLs.  In the ED, patient was noted to have sodium of 124.  Head CT scan was unremarkable.  X-ray of the pelvis showed comminuted angulated intertrochanteric fracture of the proximal right femur.  Orthopedics was consulted and patient was admitted to hospital.   During hospitalization, patient had hyponatremia with sodium slightly improved but patient was asymptomatic.  She subsequently underwent hip surgery on 03/15/2021 by orthopedics.  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  Orthopedics was consulted and patient underwent right intertrochanteric femur fracture IM nailing on 03/15/2021.  Postoperative course was unremarkable.  Acute blood loss anemia/postoperative anemia.  Hemoglobin of 6.4.  No external obvious bleeding.  Transfuse 1 unit of packed RBC today.  Check labs in AM.  Acute urinary retention.  Initially was on Foley catheter.  Improved at this time.  Hyponatremia-    Serum osmolality low.  Urine osmolality high but urine sodium low.  Serum cortisol and TSH within normal limits.  Patient was started on salt tablets.  Sodium has improved 134.  Will discontinue.  Prolonged QTC-508.  No prior EKGs.  Continue to hold Lexapro.   Monitor electrolytes.  Telemetry monitor.   Dementia-advanced.  Needing assistance with ADLs.  Continue donepezil, memantine, will continue to hold Lexapro for now. Seen by speech therapy and recommended dysphagia 1 diet.    Essential HTN Consider discontinuation of HCTZ on discharge due to significant hyponatremia on presentation.  Continue enalapril and Coreg.  Blood pressure is stable at this time.   Hypothyroidism Continue Synthroid   CKD 3a- cr 1.03  Stable.     DVT prophylaxis: SCDs Start: 03/12/21 2111   Code Status: Full code  Family Communication: Spoke with the patient's sister at bedside and obtained consent regarding blood transfusion.  Status is: Inpatient  Remains inpatient appropriate because: IV treatments appropriate due to intensity of illness or inability to take PO, Inpatient level of care appropriate due to severity of illness, need for blood transfusion, skilled nursing facility placement.  Dispo: The patient is from: Home              Anticipated d/c is to: SNF by tomorrow if continues to remain stable and hemoglobin improved.  Patient has not been keen on participating with physical therapy.              Patient currently is  not medically stable to d/c.   Difficult to place patient No   Consultants: Orthopedics  Procedures: Right intertrochanteric femur fracture status post IM nailing on 03/15/2021  Anti-infectives:  Preop  Anti-infectives (From admission, onward)    Start     Dose/Rate Route Frequency Ordered Stop   03/16/21 0600  ceFAZolin (ANCEF) IVPB 2g/100 mL premix  Status:  Discontinued        2 g 200 mL/hr over  30 Minutes Intravenous On call to O.R. 03/15/21 1421 03/15/21 1436   03/15/21 2300  ceFAZolin (ANCEF) IVPB 2g/100 mL premix        2 g 200 mL/hr over 30 Minutes Intravenous Every 8 hours 03/15/21 1804 03/16/21 1718   03/15/21 1445  ceFAZolin (ANCEF) IVPB 2g/100 mL premix        2 g 200 mL/hr over 30 Minutes Intravenous On call  to O.R. 03/15/21 1420 03/15/21 1522   03/15/21 1423  ceFAZolin (ANCEF) 2-4 GM/100ML-% IVPB       Note to Pharmacy: Devoria Glassing   : cabinet override      03/15/21 1423 03/15/21 1525   03/13/21 0600  ceFAZolin (ANCEF) IVPB 2g/100 mL premix        2 g 200 mL/hr over 30 Minutes Intravenous On call to O.R. 03/12/21 1808 03/14/21 0559       Subjective:  Patient was seen and examined at bedside.  Denies any pain, history of dementia.  Nursing staff reported that the patient's hemoglobin had dropped.  No external bleeding.  Objective: Vitals:   03/17/21 2104 03/18/21 0501  BP: 90/77 (!) 127/52  Pulse: 61 67  Resp: 16 16  Temp: 97.8 F (36.6 C) 97.6 F (36.4 C)  SpO2: 91% 96%    Intake/Output Summary (Last 24 hours) at 03/18/2021 1240 Last data filed at 03/18/2021 0900 Gross per 24 hour  Intake 600 ml  Output 500 ml  Net 100 ml    Filed Weights   03/12/21 1420  Weight: 67.6 kg   Body mass index is 22 kg/m.   Physical Exam: General:  Average built, not in obvious distress, alert awake and mildly communicative, has underlying dementia, HENT:   No scleral pallor or icterus noted. Oral mucosa is moist.  Chest:  Clear breath sounds.  Diminished breath sounds bilaterally. No crackles or wheezes.  CVS: S1 &S2 heard. No murmur.  Regular rate and rhythm. Abdomen: Soft, nontender, nondistended.  Bowel sounds are heard.   Extremities: No cyanosis, clubbing or edema.  Peripheral pulses are palpable.  Status post right hip surgery with dressing. Psych: Alert, awake and disoriented, underlying dementia, CNS:  No cranial nerve deficits.  Moves extremities. Skin: Warm and dry.  No rashes noted.  Data Review: I have personally reviewed the following laboratory data and studies,  CBC: Recent Labs  Lab 03/12/21 1441 03/13/21 0531 03/17/21 0556 03/18/21 0422  WBC 6.5 7.3 9.5 7.4  NEUTROABS 4.7  --   --   --   HGB 10.9* 10.2* 7.6* 6.4*  HCT 33.5* 31.0* 23.9* 19.7*  MCV 92.8 91.7  94.1 92.9  PLT 193 203 188 180    Basic Metabolic Panel: Recent Labs  Lab 03/12/21 1441 03/13/21 0531 03/14/21 0415 03/14/21 1743 03/15/21 1809 03/17/21 0556 03/18/21 0422  NA 124*   < > 125* 124* 128* 132* 134*  K 4.3   < > 3.7 3.8 4.2 4.2 4.0  CL 90*   < > 91* 90* 93* 95* 98  CO2 26   < > 27 27 30 27 30   GLUCOSE 168*   < > 180* 244* 204* 169* 148*  BUN 14   < > 17 22 22  30* 30*  CREATININE 1.11*   < > 1.08* 1.33* 1.20* 1.13* 1.02*  CALCIUM 8.8*   < > 8.7* 8.3* 8.4* 8.8* 8.3*  MG 1.6*  --   --   --   --  1.8  --   PHOS  --   --   --   --   --  2.8  --    < > = values in this interval not displayed.    Liver Function Tests: Recent Labs  Lab 03/12/21 1441 03/17/21 0556  AST 26 36  ALT 19 14  ALKPHOS 72 51  BILITOT 0.8 1.1  PROT 6.4* 6.1*  ALBUMIN 3.2* 2.8*    No results for input(s): LIPASE, AMYLASE in the last 168 hours. No results for input(s): AMMONIA in the last 168 hours. Cardiac Enzymes: No results for input(s): CKTOTAL, CKMB, CKMBINDEX, TROPONINI in the last 168 hours. BNP (last 3 results) No results for input(s): BNP in the last 8760 hours.  ProBNP (last 3 results) No results for input(s): PROBNP in the last 8760 hours.  CBG: No results for input(s): GLUCAP in the last 168 hours. Recent Results (from the past 240 hour(s))  Resp Panel by RT-PCR (Flu A&B, Covid) Nasopharyngeal Swab     Status: None   Collection Time: 03/12/21  2:57 PM   Specimen: Nasopharyngeal Swab; Nasopharyngeal(NP) swabs in vial transport medium  Result Value Ref Range Status   SARS Coronavirus 2 by RT PCR NEGATIVE NEGATIVE Final    Comment: (NOTE) SARS-CoV-2 target nucleic acids are NOT DETECTED.  The SARS-CoV-2 RNA is generally detectable in upper respiratory specimens during the acute phase of infection. The lowest concentration of SARS-CoV-2 viral copies this assay can detect is 138 copies/mL. A negative result does not preclude SARS-Cov-2 infection and should not be used  as the sole basis for treatment or other patient management decisions. A negative result may occur with  improper specimen collection/handling, submission of specimen other than nasopharyngeal swab, presence of viral mutation(s) within the areas targeted by this assay, and inadequate number of viral copies(<138 copies/mL). A negative result must be combined with clinical observations, patient history, and epidemiological information. The expected result is Negative.  Fact Sheet for Patients:  BloggerCourse.com  Fact Sheet for Healthcare Providers:  SeriousBroker.it  This test is no t yet approved or cleared by the [place] FDA and  has been authorized for detection and/or diagnosis of SARS-CoV-2 by FDA under an Emergency Use Authorization (EUA). This EUA will remain  in effect (meaning this test can be used) for the duration of the COVID-19 declaration under Section 564(b)(1) of the Act, 21 U.S.C.section 360bbb-3(b)(1), unless the authorization is terminated  or revoked sooner.       Influenza A by PCR NEGATIVE NEGATIVE Final   Influenza B by PCR NEGATIVE NEGATIVE Final    Comment: (NOTE) The Xpert Xpress SARS-CoV-2/FLU/RSV plus assay is intended as an aid in the diagnosis of influenza from Nasopharyngeal swab specimens and should not be used as a sole basis for treatment. Nasal washings and aspirates are unacceptable for Xpert Xpress SARS-CoV-2/FLU/RSV testing.  Fact Sheet for Patients: BloggerCourse.com  Fact Sheet for Healthcare Providers: SeriousBroker.it  This test is not yet approved or cleared by the [place] FDA and has been authorized for detection and/or diagnosis of SARS-CoV-2 by FDA under an Emergency Use Authorization (EUA). This EUA will remain in effect (meaning this test can be used) for the duration of the COVID-19 declaration under Section 564(b)(1)  of the Act, 21 U.S.C. section 360bbb-3(b)(1), unless the authorization is terminated or revoked.  Performed at [facility], [address], [place], [place] [address]   Surgical pcr screen     Status: None   Collection Time: 03/14/21  2:41 PM   Specimen: Nasal Mucosa; Nasal Swab  Result Value Ref Range Status   MRSA, PCR  NEGATIVE NEGATIVE Final   Staphylococcus aureus NEGATIVE NEGATIVE Final    Comment: (NOTE) The Xpert SA Assay (FDA approved for NASAL specimens in patients 38 years of age and older), is one component of a comprehensive surveillance program. It is not intended to diagnose infection nor to guide or monitor treatment. Performed at [facility], [address], [place], [place] [address]       Studies: No results found.    Joycelyn Das, MD  [facility] 03/18/2021  If 7PM-7AM, please contact night-coverage

## 2021-03-18 NOTE — Progress Notes (Signed)
Critical Value = hgb 6.4  Received by = Bess Harvest, RN at 03/18/[id number]  Dr. Tyson Babinski 03/18/2021 at [phone]

## 2021-03-19 DIAGNOSIS — F0390 Unspecified dementia without behavioral disturbance: Secondary | ICD-10-CM | POA: Diagnosis not present

## 2021-03-19 DIAGNOSIS — S72001A Fracture of unspecified part of neck of right femur, initial encounter for closed fracture: Secondary | ICD-10-CM | POA: Diagnosis not present

## 2021-03-19 LAB — CBC
HCT: 27.9 % — ABNORMAL LOW (ref 36.0–46.0)
Hemoglobin: 8.7 g/dL — ABNORMAL LOW (ref 12.0–15.0)
MCH: 29.9 pg (ref 26.0–34.0)
MCHC: 31.2 g/dL (ref 30.0–36.0)
MCV: 95.9 fL (ref 80.0–100.0)
Platelets: 212 10*3/uL (ref 150–400)
RBC: 2.91 MIL/uL — ABNORMAL LOW (ref 3.87–5.11)
RDW: 13.7 % (ref 11.5–15.5)
WBC: 10 10*3/uL (ref 4.0–10.5)
nRBC: 0.4 % — ABNORMAL HIGH (ref 0.0–0.2)

## 2021-03-19 LAB — RESP PANEL BY RT-PCR (FLU A&B, COVID) ARPGX2
Influenza A by PCR: NEGATIVE
Influenza B by PCR: NEGATIVE
SARS Coronavirus 2 by RT PCR: NEGATIVE

## 2021-03-19 LAB — BASIC METABOLIC PANEL
Anion gap: 7 (ref 5–15)
BUN: 28 mg/dL — ABNORMAL HIGH (ref 8–23)
CO2: 30 mmol/L (ref 22–32)
Calcium: 8.9 mg/dL (ref 8.9–10.3)
Chloride: 103 mmol/L (ref 98–111)
Creatinine, Ser: 0.97 mg/dL (ref 0.44–1.00)
GFR, Estimated: 57 mL/min — ABNORMAL LOW (ref >60)
Glucose, Bld: 161 mg/dL — ABNORMAL HIGH (ref 70–99)
Potassium: 4.8 mmol/L (ref 3.5–5.1)
Sodium: 140 mmol/L (ref 135–145)

## 2021-03-19 MED ORDER — POLYETHYLENE GLYCOL 3350 17 G PO PACK: 17.0000 g | PACK | Freq: Every day | ORAL | 0 refills | Status: AC | PRN

## 2021-03-19 MED ORDER — LEVETIRACETAM 100 MG/ML PO SOLN
500.0000 mg | Freq: Every day | ORAL | Status: DC
  Administered 2021-03-19: 500 mg via ORAL
  Filled 2021-03-19 (×2): qty 5

## 2021-03-19 MED ORDER — ACETAMINOPHEN 325 MG PO TABS: 650.0000 mg | ORAL_TABLET | Freq: Four times a day (QID) | ORAL | Status: AC | PRN

## 2021-03-19 NOTE — NC FL2 (Signed)
[place] MEDICAID FL2 LEVEL OF CARE SCREENING TOOL     IDENTIFICATION  Patient Name: Latoya Perry Birthdate: [birth year] Sex: female Admission Date (Current Location): 03/12/2021  [facility] and [place] Number:  [employer] and Address:  [facility],  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, unspecified fracture morphology, subsequent encounter 03/12/2021    Orientation RESPIRATION BLADDER Height & Weight     Self  Normal Incontinent, External catheter Weight: 149 lb (67.6 kg) Height:  5\' 9"  (175.3 cm)  BEHAVIORAL SYMPTOMS/MOOD NEUROLOGICAL BOWEL NUTRITION STATUS      Continent Diet (dysphagia 1 diet)  AMBULATORY STATUS COMMUNICATION OF NEEDS Skin   Extensive Assist Verbally Surgical wounds (Recent hip surgery)                       Personal Care Assistance Level of Assistance  Bathing, Feeding, Dressing, Total care Bathing Assistance: Maximum assistance Feeding assistance: Independent Dressing Assistance: Maximum assistance Total Care Assistance: Maximum assistance   Functional Limitations Info  Sight, Hearing, Speech Sight Info: Adequate Hearing Info: Adequate Speech Info: Adequate    SPECIAL CARE FACTORS FREQUENCY  PT (By licensed PT), OT (By licensed OT)     PT Frequency: 5 times weekly OT Frequency: 5 times weekly            Contractures Contractures Info: Not  present    Additional Factors Info  Code Status, Allergies Code Status Info: FULL Allergies Info: NKA           Current Medications (03/19/2021):  This is the current hospital active medication list Current Facility-Administered Medications  Medication Dose Route Frequency Provider Last Rate Last Admin   acetaminophen (TYLENOL) tablet 650 mg  650 mg Oral Q6H PRN 05/19/2021, MD   650 mg at 03/17/21 1039   Or   acetaminophen (TYLENOL) suppository 650 mg  650 mg Rectal Q6H PRN 03/19/21, MD       carvedilol (COREG) tablet 25 mg  25 mg Oral Daily Oliver Barre, MD   25 mg at 03/19/21 05/19/21   Chlorhexidine Gluconate Cloth 2 % PADS 6 each  6 each Topical Daily 8676, MD   6 each at 03/19/21 0953   donepezil (ARICEPT) tablet 10 mg  10 mg Oral QHS 05/19/21, MD   10 mg at 03/17/21 2114   dorzolamide-timolol (COSOPT) 22.3-6.8 MG/ML ophthalmic solution 1 drop  1 drop Both Eyes BID 2115, MD   1 drop at 03/19/21 0953   enalapril (VASOTEC) tablet 2.5 mg  2.5 mg Oral Daily 05/19/21, MD   2.5 mg at 03/18/21 0904   famotidine (PEPCID) tablet 20 mg  20 mg Oral QHS 05/18/21, MD   20 mg at 03/17/21 2114   latanoprost (XALATAN) 0.005 % ophthalmic solution 1 drop  1 drop Both Eyes QHS 2115  A, MD   1 drop at 03/18/21 2232   levETIRAcetam (KEPPRA) 100 MG/ML solution 500 mg  500 mg Oral Daily Pokhrel, Laxman, MD   500 mg at 03/19/21 0950   levothyroxine (SYNTHROID) tablet 112 mcg  112 mcg Oral Q0600 Oliver Barre, MD   112 mcg at 03/18/21 0520   MEDLINE mouth rinse  15 mL Mouth Rinse BID Pokhrel, Laxman, MD   15 mL at 03/18/21 2233   memantine (NAMENDA) tablet 5 mg  5 mg Oral Daily Oliver Barre, MD   5 mg at 03/19/21 0948   morphine 2 MG/ML injection 2 mg  2 mg Intravenous Q4H PRN Oliver Barre, MD   2 mg at 03/19/21 0842   polyethylene glycol (MIRALAX / GLYCOLAX) packet 17 g  17 g Oral Daily PRN Oliver Barre, MD       promethazine (PHENERGAN) tablet  12.5 mg  12.5 mg Oral Q6H PRN Oliver Barre, MD         Discharge Medications: Please see discharge summary for a list of discharge medications.  Relevant Imaging Results:  Relevant Lab Results:   Additional Information SSN: [id number]  [address] [place]

## 2021-03-19 NOTE — Care Management Important Message (Signed)
Important Message  Patient Details  Name: Latoya Perry MRN: [id number] Date of Birth: 1933/08/21   Medicare Important Message Given:  Yes     Corey Harold 03/19/2021, 2:28 PM

## 2021-03-19 NOTE — Progress Notes (Signed)
   ORTHOPAEDIC PROGRESS NOTE  S/p Procedure(s): INTRAMEDULLARY (IM) NAIL INTERTROCHANTRIC  DOS: 03/15/2021  SUBJECTIVE: Confusion.  Pleasant.  Sister in room today.  Hb low, received transfusion yesterday.  Complaining of pain in feet.  No issues over night.   OBJECTIVE: PE:  Comfortable.  Demented, does not respond appropriately  No swelling in bilateral lower extremities.  Lateral hip incisions intact.  Strikethrough on proximal dressing.  Small amount of drainage on distal dressing. No obvious swelling of the right thigh.   Vitals:   03/19/21 0456 03/19/21 0820  BP: 139/64 (!) 116/54  Pulse: 63   Resp: 18   Temp: 98.9 F (37.2 C)   SpO2: 96%      ASSESSMENT: Latoya Perry is a 87 y.o. female doing well postop.  Issues with low Hb but no obvious signs of bleeding in thigh or hip.   PLAN: Weightbearing: WBAT RLE Insicional and dressing care: Reinforce dressings as needed Orthopedic device(s): None VTE prophylaxis: at discretion of Medicine team Pain control: PO medications as needed; judicious use of narcotics Follow - up plan: 2 weeks after surgery; if SNF comfortable removing staples 2 weeks postop and have no concerns, we can see patient at 6 weeks postop.   Ok to DC from orthopaedics perspective   Contact information:     Tyronn Golda A. Dallas Schimke, MD MS [facility] [address] [place],  [place]  [address] Phone: [phone] Fax: [phone]

## 2021-03-19 NOTE — Evaluation (Signed)
Physical Therapy Evaluation Patient Details Name: Latoya Perry MRN: [id number] DOB: Oct 10, 1933 Today's Date: 03/19/2021   History of Present Illness  Latoya Perry is a 87 y.o. female s/p Right intertrochanteric femur fracture on 03/15/21 with medical history significant for dementia.  Patient was brought to the ED via EMS reports of a fall, patient was walking down 2 steps and fell on her right hip.  History is obtained from chart review, patient has significant dementia limiting history.  Patient has a sitter that sits with her during the day.  Patient is normally able to walk with assistance with a gait belt.  I talked to patient's sister Latoya Perry, patient fell because she missed the second step while coming down the stairs.  Patient's dementia is advanced and she is not able to communicate, assistance with all ADLs.   Clinical Impression  Patient demonstrates slow labored movement for sitting up at bedside with c/o increased pain in right hip, easily agitated when right hip pain increases, requires repeated verbal/tactile cueing to follow directions and limited to a few side steps at bedside due to weakness and poor standing balance using RW.  Patient tolerated sitting up in chair after therapy with her sister present in room.  Patient will benefit from continued physical therapy in hospital and recommended venue below to increase strength, balance, endurance for safe ADLs and gait.       Follow Up Recommendations SNF    Equipment Recommendations  None recommended by PT    Recommendations for Other Services       Precautions / Restrictions Precautions Precautions: Fall Restrictions Weight Bearing Restrictions: Yes RLE Weight Bearing: Weight bearing as tolerated      Mobility  Bed Mobility Overal bed mobility: Needs Assistance Bed Mobility: Supine to Sit     Supine to sit: Max assist;Mod assist     General bed mobility comments: slow labored movement with c/o sever  pain RLE with movement    Transfers Overall transfer level: Needs assistance Equipment used: Rolling walker (2 wheeled) Transfers: Sit to/from [employer] Sit to Stand: Mod assist;Max assist Stand pivot transfers: Max assist;+2 physical assistance       General transfer comment: very unsteady on feet and requires repeated verbal/tactile cueing to follow directions  Ambulation/Gait Ambulation/Gait assistance: Max assist;+2 physical assistance Gait Distance (Feet): 3 Feet Assistive device: Rolling walker (2 wheeled) Gait Pattern/deviations: Decreased step length - right;Decreased step length - left;Decreased stance time - right;Decreased stride length;Antalgic;Shuffle Gait velocity: decreased   General Gait Details: limited to 2-3 slow labored unsteady side steps with mostly shuffling of RLE due to increased pain when weightbearing  Stairs            Wheelchair Mobility    Modified Rankin (Stroke Patients Only)       Balance Overall balance assessment: Needs assistance Sitting-balance support: Feet supported;No upper extremity supported Sitting balance-Leahy Scale: Fair Sitting balance - Comments: seated at EOB   Standing balance support: During functional activity;Bilateral upper extremity supported Standing balance-Leahy Scale: Poor Standing balance comment: using RW                             Pertinent Vitals/Pain Pain Assessment: Faces Faces Pain Scale: Hurts even more Pain Location: right hip with movement Pain Descriptors / Indicators: Grimacing;Guarding;Sore Pain Intervention(s): Limited activity within patient's tolerance;Monitored during session;RN gave pain meds during session;Repositioned    Home Living Family/patient expects to be discharged to:: Private  residence Living Arrangements: Other relatives Available Help at Discharge: Family;Available 24 hours/day Type of Home: House Home Access: Stairs to enter Entrance  Stairs-Rails: Right;Left;Can reach both Entrance Stairs-Number of Steps: 2 Home Layout: Two level;Laundry or work area in [employer] Equipment: Environmental consultant - 2 wheels;Shower seat;Bedside commode;Cane - single point      Prior Function Level of Independence: Needs assistance   Gait / Transfers Assistance Needed: assisted transfers and very short household distances using RW  ADL's / Homemaking Assistance Needed: assisted by family        Hand Dominance        Extremity/Trunk Assessment   Upper Extremity Assessment Upper Extremity Assessment: Defer to OT evaluation    Lower Extremity Assessment Lower Extremity Assessment: Generalized weakness;RLE deficits/detail RLE Deficits / Details: grossly -3/5 RLE: Unable to fully assess due to pain RLE Sensation: WNL RLE Coordination: WNL    Cervical / Trunk Assessment Cervical / Trunk Assessment: Normal  Communication   Communication: No difficulties  Cognition Arousal/Alertness: Awake/alert Behavior During Therapy: Agitated;Anxious Overall Cognitive Status: History of cognitive impairments - at baseline                                        General Comments      Exercises     Assessment/Plan    PT Assessment Patient needs continued PT services  PT Problem List Decreased strength;Decreased activity tolerance;Decreased balance;Decreased mobility       PT Treatment Interventions DME instruction;Gait training;Stair training;Functional mobility training;Therapeutic activities;Therapeutic exercise;Balance training;Patient/family education    PT Goals (Current goals can be found in the Care Plan section)  Acute Rehab PT Goals Patient Stated Goal: return home PT Goal Formulation: With patient/family Time For Goal Achievement: 04/02/21 Potential to Achieve Goals: Good    Frequency Min 3X/week   Barriers to discharge        Co-evaluation PT/OT/SLP Co-Evaluation/Treatment: Yes Reason for Co-Treatment:  Necessary to address cognition/behavior during functional activity;To address functional/ADL transfers PT goals addressed during session: Mobility/safety with mobility;Proper use of DME         AM-PAC PT "6 Clicks" Mobility  Outcome Measure Help needed turning from your back to your side while in a flat bed without using bedrails?: A Lot Help needed moving from lying on your back to sitting on the side of a flat bed without using bedrails?: A Lot Help needed moving to and from a bed to a chair (including a wheelchair)?: A Lot Help needed standing up from a chair using your arms (e.g., wheelchair or bedside chair)?: A Lot Help needed to walk in hospital room?: A Lot Help needed climbing 3-5 steps with a railing? : Total 6 Click Score: 11    End of Session   Activity Tolerance: Patient tolerated treatment well;Patient limited by fatigue Patient left: in chair;with call bell/phone within reach;with family/visitor present;with chair alarm set Nurse Communication: Mobility status PT Visit Diagnosis: Unsteadiness on feet (R26.81);Other abnormalities of gait and mobility (R26.89);Muscle weakness (generalized) (M62.81)    Time: 3500-9381 PT Time Calculation (min) (ACUTE ONLY): 37 min   Charges:   PT Evaluation $PT Eval Moderate Complexity: 1 Mod PT Treatments $Therapeutic Activity: 23-37 mins        10:52 AM, 03/19/21 Ocie Bob, MPT Physical Therapist with [facility] [phone] office [phone] mobile phone

## 2021-03-19 NOTE — Discharge Summary (Signed)
Physician Discharge Summary  Latoya DolphinMattie Lehnert ZOX:[id number] DOB: 01/16/1934 DOA: 03/12/2021  PCP: Smith RobertKikel, Stephen, MD  Admit date: 03/12/2021 Discharge date: 03/19/2021  Admitted From: Home  Discharge disposition: Skilled nursing facility placement  Recommendations for Outpatient Follow-Up:   Follow up with your primary care provider at the skilled nursing facility in 3 to 5 days. Check CBC, BMP, magnesium in the next visit Follow-up with orthopedics in 2 weeks for regular follow-up/staple removal.  If able to remove the staples at the facility okay to follow-up with orthopedics in 6 weeks.  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  Diet recommendation: Dysphagia 1 diet  Wound care: None.  Code status: Full.   History of Present Illness:   Latoya Perry is a 87 y.o. female with medical history significant for dementia, presented to the hospital after sustaining a fall.  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  Patient has advanced dementia and is unable to communicate accurately.  Needs assistance with all ADLs.  In the ED, patient was noted to have sodium of 124.  Head CT scan was unremarkable.  X-ray of the pelvis showed comminuted angulated intertrochanteric fracture of the proximal right femur.  Orthopedics was consulted and patient was admitted to hospital.   Hospital Course:   Following conditions were addressed during hospitalization as listed below,  Closed right hip fracture 2/2 mechanical fall- Cervical spine CT unremarkable, pelvic x-ray showed comminuted intratrochanteric fracture of the proximal right femur.  Orthopedics was consulted and patient underwent right intertrochanteric femur fracture IM nailing on 03/15/2021.  Postoperative course was  unremarkable.  Patient was seen by physical therapy and recommended skilled nursing facility placement. Will need to follow-up with orthopedics as outpatient.  Acute blood loss anemia/postoperative anemia.  Hemoglobin has improved to 8.7 after PRBC transfusion yesterday.  Follow-up with CBC in the next visit   Acute urinary retention.  Initially was on Foley catheter.  Improved at this time.  External urinary catheter in place   Hyponatremia-    Serum osmolality low.  Urine osmolality high but urine sodium low.  Serum cortisol and TSH within normal limits.  .  Sodium has improved at this time.     Prolonged QTC-508.  No prior EKGs.  Continue to hold Lexapro.   Telemetry monitor.   Dementia-advanced.  Needing assistance with ADLs.  Continue donepezil, memantine, will continue to hold Lexapro for now. Seen by speech therapy and recommended dysphagia 1 diet.     Essential HTN Will discontinue HCTZ on discharge due to significant hyponatremia on presentation.  Continue enalapril and Coreg.  Blood pressure is stable at this time.   Hypothyroidism Continue Synthroid   CKD 3a- creatinine of 0.9.  Disposition.  At this time, patient is stable for disposition to skilled nursing facility.  Spoke with the patient's family member at bedside  Medical Consultants:   Orthopedics  Procedures:    Right intertrochanteric femur fracture status post IM nailing on 03/15/2021 Subjective:   Today, patient was seen and examined at bedside. Patient denies any pain, nausea or vomiting.  Patient's family at bedside.  Discharge Exam:   Vitals:   03/19/21 0820 03/19/21 1319  BP: (!) 116/54 (!) 124/54  Pulse:    Resp:    Temp:    SpO2:  Vitals:   03/18/21 2119 03/19/21 0456 03/19/21 0820 03/19/21 1319  BP: (!) 131/49 139/64 (!) 116/54 (!) 124/54  Pulse: 65 63    Resp: 18 18    Temp: (!) 97.5 F (36.4 C) 98.9 F (37.2 C)    TempSrc:      SpO2: 100% 96%    Weight:      Height:        General:  Average built, not in obvious distress, alert awake and mildly communicative, has underlying dementia HENT:   No scleral pallor or icterus noted. Oral mucosa is moist.  Chest:  Clear breath sounds.  Diminished breath sounds bilaterally. No crackles or wheezes.  CVS: S1 &S2 heard. No murmur.  Regular rate and rhythm. Abdomen: Soft, nontender, nondistended.  Bowel sounds are heard.   Extremities: No cyanosis, clubbing or edema.  Peripheral pulses are palpable.  Status post right hip surgery with dressing Psych: Alert, awake and oriented, normal mood CNS:  No cranial nerve deficits.  Moves extremities. Skin: Warm and dry.    The results of significant diagnostics from this hospitalization (including imaging, microbiology, ancillary and laboratory) are listed below for reference.     Diagnostic Studies:   DG Chest 1 View  Result Date: 03/12/2021 CLINICAL DATA:  Right hip fracture. EXAM: CHEST  1 VIEW COMPARISON:  None. FINDINGS: The heart size and mediastinal contours are within normal limits. Both lungs are clear. The visualized skeletal structures are unremarkable. IMPRESSION: No active disease. Aortic Atherosclerosis (ICD10-I70.0). Electronically Signed   By: Lupita Raider M.D.   On: 03/12/2021 17:15   DG Tibia/Fibula Right  Result Date: 03/12/2021 CLINICAL DATA:  Right leg pain after fall today. EXAM: RIGHT TIBIA AND FIBULA - 2 VIEW COMPARISON:  None. FINDINGS: There is no evidence of fracture or other focal bone lesions. Soft tissues are unremarkable. IMPRESSION: Negative. Electronically Signed   By: Lupita Raider M.D.   On: 03/12/2021 17:14   CT Head Wo Contrast  Result Date: 03/12/2021 CLINICAL DATA:  Head trauma, moderate to severe.  Fall down steps EXAM: CT HEAD WITHOUT CONTRAST TECHNIQUE: Contiguous axial images were obtained from the base of the skull through the vertex without intravenous contrast. COMPARISON:  None. FINDINGS: Brain: There is atrophy and chronic  small vessel disease changes. No acute intracranial abnormality. Specifically, no hemorrhage, hydrocephalus, mass lesion, acute infarction, or significant intracranial injury. Vascular: No hyperdense vessel or unexpected calcification. Skull: No acute calvarial abnormality. Sinuses/Orbits: Visualized paranasal sinuses and mastoids clear. Orbital soft tissues unremarkable. Other: None IMPRESSION: Atrophy, chronic microvascular disease. No acute intracranial abnormality. Electronically Signed   By: Charlett Nose M.D.   On: 03/12/2021 16:10   CT Cervical Spine Wo Contrast  Result Date: 03/12/2021 CLINICAL DATA:  Fall down steps.  Neck trauma. EXAM: CT CERVICAL SPINE WITHOUT CONTRAST TECHNIQUE: Multidetector CT imaging of the cervical spine was performed without intravenous contrast. Multiplanar CT image reconstructions were also generated. COMPARISON:  None. FINDINGS: Alignment: No subluxation. Skull base and vertebrae: No acute fracture. No primary bone lesion or focal pathologic process. Soft tissues and spinal canal: No prevertebral fluid or swelling. No visible canal hematoma. Disc levels: Diffuse degenerative disc disease with anterior spurring. Moderate bilateral diffuse degenerative facet disease. Upper chest: No acute findings Other: None IMPRESSION: Degenerative disc and facet disease. No acute bony abnormality. Electronically Signed   By: Charlett Nose M.D.   On: 03/12/2021 16:11   DG Femur 1 View Right  Result Date: 03/12/2021 CLINICAL DATA:  Right hip pain after fall today. EXAM: RIGHT FEMUR 1 VIEW COMPARISON:  None. FINDINGS: Moderately comminuted and displaced fracture is seen involving the intertrochanteric region of the proximal right femur. The middle and distal aspects of the femur are unremarkable. IMPRESSION: Moderately comminuted and displaced intertrochanteric fracture of proximal right femur. Electronically Signed   By: Lupita Raider M.D.   On: 03/12/2021 17:13   DG HIPS BILAT WITH  PELVIS MIN 5 VIEWS  Result Date: 03/12/2021 CLINICAL DATA:  Fall today, leg and hip pain. EXAM: DG HIP (WITH OR WITHOUT PELVIS) 5+V BILAT COMPARISON:  Femur evaluation of the same date. FINDINGS: Comminuted intratrochanteric fracture of the RIGHT proximal femur with varus angulation and moderate displacement of the lesser trochanter. Femoral head is located within the acetabulum. There is also apex anterior angulation seen on the cross-table lateral. LEFT hip is located without signs of fracture. There are signs of vascular disease. Bony pelvis without signs of fracture. IMPRESSION: Comminuted, angulated intertrochanteric fracture of the proximal RIGHT femur with varus angulation and moderate displacement of the lesser trochanter. Electronically Signed   By: Donzetta Kohut M.D.   On: 03/12/2021 17:16     Labs:   Basic Metabolic Panel: Recent Labs  Lab 03/12/21 1441 03/13/21 0531 03/14/21 1743 03/15/21 1809 03/17/21 0556 03/18/21 0422 03/19/21 0519  NA 124*   < > 124* 128* 132* 134* 140  K 4.3   < > 3.8 4.2 4.2 4.0 4.8  CL 90*   < > 90* 93* 95* 98 103  CO2 26   < > 27 30 27 30 30   GLUCOSE 168*   < > 244* 204* 169* 148* 161*  BUN 14   < > 22 22 30* 30* 28*  CREATININE 1.11*   < > 1.33* 1.20* 1.13* 1.02* 0.97  CALCIUM 8.8*   < > 8.3* 8.4* 8.8* 8.3* 8.9  MG 1.6*  --   --   --  1.8  --   --   PHOS  --   --   --   --  2.8  --   --    < > = values in this interval not displayed.   GFR Estimated Creatinine Clearance: 44.3 mL/min (by C-G formula based on SCr of 0.97 mg/dL). Liver Function Tests: Recent Labs  Lab 03/12/21 1441 03/17/21 0556  AST 26 36  ALT 19 14  ALKPHOS 72 51  BILITOT 0.8 1.1  PROT 6.4* 6.1*  ALBUMIN 3.2* 2.8*   No results for input(s): LIPASE, AMYLASE in the last 168 hours. No results for input(s): AMMONIA in the last 168 hours. Coagulation profile No results for input(s): INR, PROTIME in the last 168 hours.  CBC: Recent Labs  Lab 03/12/21 1441  03/13/21 0531 03/17/21 0556 03/18/21 0422 03/19/21 0519  WBC 6.5 7.3 9.5 7.4 10.0  NEUTROABS 4.7  --   --   --   --   HGB 10.9* 10.2* 7.6* 6.4* 8.7*  HCT 33.5* 31.0* 23.9* 19.7* 27.9*  MCV 92.8 91.7 94.1 92.9 95.9  PLT 193 203 188 180 212   Cardiac Enzymes: No results for input(s): CKTOTAL, CKMB, CKMBINDEX, TROPONINI in the last 168 hours. BNP: Invalid input(s): POCBNP CBG: No results for input(s): GLUCAP in the last 168 hours. D-Dimer No results for input(s): DDIMER in the last 72 hours. Hgb A1c No results for input(s): HGBA1C in the last 72 hours. Lipid Profile No results for input(s): CHOL, HDL, LDLCALC, TRIG, CHOLHDL, LDLDIRECT in the last 72  hours. Thyroid function studies No results for input(s): TSH, T4TOTAL, T3FREE, THYROIDAB in the last 72 hours.  Invalid input(s): FREET3 Anemia work up No results for input(s): VITAMINB12, FOLATE, FERRITIN, TIBC, IRON, RETICCTPCT in the last 72 hours. Microbiology Recent Results (from the past 240 hour(s))  Resp Panel by RT-PCR (Flu A&B, Covid) Nasopharyngeal Swab     Status: None   Collection Time: 03/12/21  2:57 PM   Specimen: Nasopharyngeal Swab; Nasopharyngeal(NP) swabs in vial transport medium  Result Value Ref Range Status   SARS Coronavirus 2 by RT PCR NEGATIVE NEGATIVE Final    Comment: (NOTE) SARS-CoV-2 target nucleic acids are NOT DETECTED.  The SARS-CoV-2 RNA is generally detectable in upper respiratory specimens during the acute phase of infection. The lowest concentration of SARS-CoV-2 viral copies this assay can detect is 138 copies/mL. A negative result does not preclude SARS-Cov-2 infection and should not be used as the sole basis for treatment or other patient management decisions. A negative result may occur with  improper specimen collection/handling, submission of specimen other than nasopharyngeal swab, presence of viral mutation(s) within the areas targeted by this assay, and inadequate number of  viral copies(<138 copies/mL). A negative result must be combined with clinical observations, patient history, and epidemiological information. The expected result is Negative.  Fact Sheet for Patients:  BloggerCourse.com  Fact Sheet for Healthcare Providers:  SeriousBroker.it  This test is no t yet approved or cleared by the [place] FDA and  has been authorized for detection and/or diagnosis of SARS-CoV-2 by FDA under an Emergency Use Authorization (EUA). This EUA will remain  in effect (meaning this test can be used) for the duration of the COVID-19 declaration under Section 564(b)(1) of the Act, 21 U.S.C.section 360bbb-3(b)(1), unless the authorization is terminated  or revoked sooner.       Influenza A by PCR NEGATIVE NEGATIVE Final   Influenza B by PCR NEGATIVE NEGATIVE Final    Comment: (NOTE) The Xpert Xpress SARS-CoV-2/FLU/RSV plus assay is intended as an aid in the diagnosis of influenza from Nasopharyngeal swab specimens and should not be used as a sole basis for treatment. Nasal washings and aspirates are unacceptable for Xpert Xpress SARS-CoV-2/FLU/RSV testing.  Fact Sheet for Patients: BloggerCourse.com  Fact Sheet for Healthcare Providers: SeriousBroker.it  This test is not yet approved or cleared by the [place] FDA and has been authorized for detection and/or diagnosis of SARS-CoV-2 by FDA under an Emergency Use Authorization (EUA). This EUA will remain in effect (meaning this test can be used) for the duration of the COVID-19 declaration under Section 564(b)(1) of the Act, 21 U.S.C. section 360bbb-3(b)(1), unless the authorization is terminated or revoked.  Performed at [facility], [address] [place], [place] [address]   Surgical pcr screen     Status: None   Collection Time: 03/14/21  2:41 PM   Specimen: Nasal Mucosa; Nasal Swab  Result  Value Ref Range Status   MRSA, PCR NEGATIVE NEGATIVE Final   Staphylococcus aureus NEGATIVE NEGATIVE Final    Comment: (NOTE) The Xpert SA Assay (FDA approved for NASAL specimens in patients 65 years of age and older), is one component of a comprehensive surveillance program. It is not intended to diagnose infection nor to guide or monitor treatment. Performed at [facility], [address] [place], [place] [address]      Discharge Instructions:   Discharge Instructions     Call MD for:  redness, tenderness, or signs of infection (pain, swelling, redness, odor  or green/yellow discharge around incision site)   Complete by: As directed    Call MD for:  severe uncontrolled pain   Complete by: As directed    Call MD for:  temperature >100.4   Complete by: As directed    Discharge instructions   Complete by: As directed    Follow-up with orthopedics in 2 weeks for wound checkup.  Dysphagia 1 diet.   Discharge wound care:   Complete by: As directed    Okay to remove staples in 2 weeks, if comfortable, at the skilled nursing facility.  If able to remove the staples can refer the patient at 6 weeks for orthopedic follow-up.  If unable to remove staples at the facility, please refer the patient to orthopedic clinic for staples removal and follow-up in 2 weeks..   Increase activity slowly   Complete by: As directed       Allergies as of 03/19/2021   No Known Allergies      Medication List     TAKE these medications    acetaminophen 325 MG tablet Commonly known as: TYLENOL Take 2 tablets (650 mg total) by mouth every 6 (six) hours as needed for mild pain or fever (or Fever >/= 101).   aspirin 81 MG EC tablet Take by mouth.   atorvastatin 20 MG tablet Commonly known as: LIPITOR Take by mouth.   azelastine 0.1 % nasal spray Commonly known as: ASTELIN Place into the nose.   calcium carbonate 1250 (500 Ca) MG tablet Commonly known as: OS-CAL - dosed in mg of elemental  calcium Take by mouth.   carvedilol 25 MG tablet Commonly known as: COREG Take 25 mg by mouth daily.   chlorthalidone 25 MG tablet Commonly known as: HYGROTON Take 1 tablet by mouth every morning.   donepezil 10 MG tablet Commonly known as: ARICEPT Take 1 tablet by mouth at bedtime.   dorzolamide-timolol 22.3-6.8 MG/ML ophthalmic solution Commonly known as: COSOPT Administer 1 drop into both eyes daily   enalapril 2.5 MG tablet Commonly known as: VASOTEC Take 2.5 mg by mouth daily.   escitalopram 20 MG tablet Commonly known as: LEXAPRO Take by mouth.   famotidine 20 MG tablet Commonly known as: PEPCID Take 1 tablet by mouth at bedtime.   ferrous sulfate 325 (65 FE) MG EC tablet Take by mouth.   latanoprost 0.005 % ophthalmic solution Commonly known as: XALATAN Place 1 drop into both eyes at bedtime.   levETIRAcetam 500 MG tablet Commonly known as: KEPPRA Take 500 mg by mouth daily.   levothyroxine 112 MCG tablet Commonly known as: SYNTHROID Take 112 mcg by mouth daily before breakfast.   memantine 5 MG tablet Commonly known as: NAMENDA Take 5 mg by mouth daily.   polyethylene glycol 17 g packet Commonly known as: MIRALAX / GLYCOLAX Take 17 g by mouth daily as needed for mild constipation.               Discharge Care Instructions  (From admission, onward)           Start     Ordered   03/19/21 0000  Discharge wound care:       Comments: Okay to remove staples in 2 weeks, if comfortable, at the skilled nursing facility.  If able to remove the staples can refer the patient at 6 weeks for orthopedic follow-up.  If unable to remove staples at the facility, please refer the patient to orthopedic clinic for staples removal and follow-up in  2 weeks.Marland Kitchen   03/19/21 1344            Follow-up Information     Oliver Barre, MD. Schedule an appointment as soon as possible for a visit in 2 week(s).   Specialties: Orthopedic Surgery, Sports  Medicine Why: for surgical followup Contact information: [address] [place] [address] [phone]                  Time coordinating discharge: 39 minutes  Signed:  Loneta Tamplin  [facility] 03/19/2021, 1:46 PM

## 2021-03-19 NOTE — Progress Notes (Signed)
PROGRESS NOTE  Latoya Perry Perry DOB: 1933/10/22 DOA: 03/12/2021 PCP: Smith RobertKikel, Stephen, MD   LOS: 7 days   Brief narrative:  Latoya Perry is a 87 y.o. female with medical history significant for dementia, presented to the hospital after sustaining a fall.  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  Patient has advanced dementia and is unable to communicate accurately.  Needs assistance with all ADLs.  In the ED, patient was noted to have sodium of 124.  Head CT scan was unremarkable.  X-ray of the pelvis showed comminuted angulated intertrochanteric fracture of the proximal right femur.  Orthopedics was consulted and patient was admitted to hospital.   During hospitalization, patient had hyponatremia with sodium slightly improved but patient was asymptomatic.  She subsequently underwent hip surgery on 03/15/2021 by orthopedics.  Assessment/Plan:  Principal Problem:   Closed right hip fracture (HCC) Active Problems:   Dementia (HCC)   Closed fracture of distal end of right femur with nonunion, unspecified fracture morphology, subsequent encounter  Closed right hip fracture 2/2 mechanical fall- Cervical spine CT unremarkable, pelvic x-ray showed comminuted intratrochanteric fracture of the proximal right femur.  Orthopedics was consulted and patient underwent right intertrochanteric femur fracture IM nailing on 03/15/2021.  Postoperative course was unremarkable.  Awaiting for skilled nursing facility placement.  Acute blood loss anemia/postoperative anemia.  Hemoglobin has improved to 8.7 after PRBC transfusion yesterday.  Acute urinary retention.  Initially was on Foley catheter.  Improved at this time.  External urinary catheter in place  Hyponatremia-    Serum osmolality low.  Urine osmolality high but urine sodium low.  Serum cortisol and TSH within normal limits.  .  Sodium has improved at this time.    Prolonged QTC-508.  No prior EKGs.  Continue to hold Lexapro.    Telemetry monitor.   Dementia-advanced.  Needing assistance with ADLs.  Continue donepezil, memantine, will continue to hold Lexapro for now. Seen by speech therapy and recommended dysphagia 1 diet.    Essential HTN Consider discontinuation of HCTZ on discharge due to significant hyponatremia on presentation.  Continue enalapril and Coreg.  Blood pressure is stable at this time.   Hypothyroidism Continue Synthroid   CKD 3a- creatinine of 0.9.   DVT prophylaxis: SCDs Start: 03/12/21 2111   Code Status: Full code  Family Communication:  Spoke with the patient's sister at bedside yesterday.  Status is: Inpatient  Remains inpatient appropriate because: IV treatments appropriate due to intensity of illness or inability to take PO, Inpatient level of care appropriate due to severity of illness, plan for skilled nursing facility placement.  Dispo: The patient is from: Home              Anticipated d/c is to: SNF when bed available.                Patient currently is   medically stable to d/c.   Difficult to place patient No   Consultants: Orthopedics  Procedures: Right intertrochanteric femur fracture status post IM nailing on 03/15/2021  Anti-infectives:  Preop  Anti-infectives (From admission, onward)    Start     Dose/Rate Route Frequency Ordered Stop   03/16/21 0600  ceFAZolin (ANCEF) IVPB 2g/100 mL premix  Status:  Discontinued        2 g 200 mL/hr over 30 Minutes Intravenous On call to O.R. 03/15/21 1421 03/15/21 1436   03/15/21 2300  ceFAZolin (ANCEF) IVPB 2g/100 mL premix        2 g  200 mL/hr over 30 Minutes Intravenous Every 8 hours 03/15/21 1804 03/16/21 1718   03/15/21 1445  ceFAZolin (ANCEF) IVPB 2g/100 mL premix        2 g 200 mL/hr over 30 Minutes Intravenous On call to O.R. 03/15/21 1420 03/15/21 1522   03/15/21 1423  ceFAZolin (ANCEF) 2-4 GM/100ML-% IVPB       Note to Pharmacy: Devoria Glassing   : cabinet override      03/15/21 1423 03/15/21 1525    03/13/21 0600  ceFAZolin (ANCEF) IVPB 2g/100 mL premix        2 g 200 mL/hr over 30 Minutes Intravenous On call to O.R. 03/12/21 1808 03/14/21 0559       Subjective: Patient was seen and examined at bedside.  Patient denies any pain, nausea or vomiting.  Patient's family at bedside.    Objective: Vitals:   03/19/21 0456 03/19/21 0820  BP: 139/64 (!) 116/54  Pulse: 63   Resp: 18   Temp: 98.9 F (37.2 C)   SpO2: 96%     Intake/Output Summary (Last 24 hours) at 03/19/2021 1133 Last data filed at 03/19/2021 0541 Gross per 24 hour  Intake 667.83 ml  Output 1200 ml  Net -532.17 ml    Filed Weights   03/12/21 1420  Weight: 67.6 kg   Body mass index is 22 kg/m.   Physical Exam:  General:  Average built, not in obvious distress, alert awake and mildly communicative, has underlying dementia HENT:   No scleral pallor or icterus noted. Oral mucosa is moist.  Chest:  Clear breath sounds.  Diminished breath sounds bilaterally. No crackles or wheezes.  CVS: S1 &S2 heard. No murmur.  Regular rate and rhythm. Abdomen: Soft, nontender, nondistended.  Bowel sounds are heard.   Extremities: No cyanosis, clubbing or edema.  Peripheral pulses are palpable.  Status post right hip surgery with dressing Psych: Alert, awake and oriented, normal mood CNS:  No cranial nerve deficits.  Moves extremities. Skin: Warm and dry.    Data Review: I have personally reviewed the following laboratory data and studies,  CBC: Recent Labs  Lab 03/12/21 1441 03/13/21 0531 03/17/21 0556 03/18/21 0422 03/19/21 0519  WBC 6.5 7.3 9.5 7.4 10.0  NEUTROABS 4.7  --   --   --   --   HGB 10.9* 10.2* 7.6* 6.4* 8.7*  HCT 33.5* 31.0* 23.9* 19.7* 27.9*  MCV 92.8 91.7 94.1 92.9 95.9  PLT 193 203 188 180 212    Basic Metabolic Panel: Recent Labs  Lab 03/12/21 1441 03/13/21 0531 03/14/21 1743 03/15/21 1809 03/17/21 0556 03/18/21 0422 03/19/21 0519  NA 124*   < > 124* 128* 132* 134* 140  K 4.3   < > 3.8  4.2 4.2 4.0 4.8  CL 90*   < > 90* 93* 95* 98 103  CO2 26   < > 27 30 27 30 30   GLUCOSE 168*   < > 244* 204* 169* 148* 161*  BUN 14   < > 22 22 30* 30* 28*  CREATININE 1.11*   < > 1.33* 1.20* 1.13* 1.02* 0.97  CALCIUM 8.8*   < > 8.3* 8.4* 8.8* 8.3* 8.9  MG 1.6*  --   --   --  1.8  --   --   PHOS  --   --   --   --  2.8  --   --    < > = values in this interval not displayed.  Liver Function Tests: Recent Labs  Lab 03/12/21 1441 03/17/21 0556  AST 26 36  ALT 19 14  ALKPHOS 72 51  BILITOT 0.8 1.1  PROT 6.4* 6.1*  ALBUMIN 3.2* 2.8*    No results for input(s): LIPASE, AMYLASE in the last 168 hours. No results for input(s): AMMONIA in the last 168 hours. Cardiac Enzymes: No results for input(s): CKTOTAL, CKMB, CKMBINDEX, TROPONINI in the last 168 hours. BNP (last 3 results) No results for input(s): BNP in the last 8760 hours.  ProBNP (last 3 results) No results for input(s): PROBNP in the last 8760 hours.  CBG: No results for input(s): GLUCAP in the last 168 hours. Recent Results (from the past 240 hour(s))  Resp Panel by RT-PCR (Flu A&B, Covid) Nasopharyngeal Swab     Status: None   Collection Time: 03/12/21  2:57 PM   Specimen: Nasopharyngeal Swab; Nasopharyngeal(NP) swabs in vial transport medium  Result Value Ref Range Status   SARS Coronavirus 2 by RT PCR NEGATIVE NEGATIVE Final    Comment: (NOTE) SARS-CoV-2 target nucleic acids are NOT DETECTED.  The SARS-CoV-2 RNA is generally detectable in upper respiratory specimens during the acute phase of infection. The lowest concentration of SARS-CoV-2 viral copies this assay can detect is 138 copies/mL. A negative result does not preclude SARS-Cov-2 infection and should not be used as the sole basis for treatment or other patient management decisions. A negative result may occur with  improper specimen collection/handling, submission of specimen other than nasopharyngeal swab, presence of viral mutation(s) within  the areas targeted by this assay, and inadequate number of viral copies(<138 copies/mL). A negative result must be combined with clinical observations, patient history, and epidemiological information. The expected result is Negative.  Fact Sheet for Patients:  BloggerCourse.com  Fact Sheet for Healthcare Providers:  SeriousBroker.it  This test is no t yet approved or cleared by the [place] FDA and  has been authorized for detection and/or diagnosis of SARS-CoV-2 by FDA under an Emergency Use Authorization (EUA). This EUA will remain  in effect (meaning this test can be used) for the duration of the COVID-19 declaration under Section 564(b)(1) of the Act, 21 U.S.C.section 360bbb-3(b)(1), unless the authorization is terminated  or revoked sooner.       Influenza A by PCR NEGATIVE NEGATIVE Final   Influenza B by PCR NEGATIVE NEGATIVE Final    Comment: (NOTE) The Xpert Xpress SARS-CoV-2/FLU/RSV plus assay is intended as an aid in the diagnosis of influenza from Nasopharyngeal swab specimens and should not be used as a sole basis for treatment. Nasal washings and aspirates are unacceptable for Xpert Xpress SARS-CoV-2/FLU/RSV testing.  Fact Sheet for Patients: BloggerCourse.com  Fact Sheet for Healthcare Providers: SeriousBroker.it  This test is not yet approved or cleared by the [place] FDA and has been authorized for detection and/or diagnosis of SARS-CoV-2 by FDA under an Emergency Use Authorization (EUA). This EUA will remain in effect (meaning this test can be used) for the duration of the COVID-19 declaration under Section 564(b)(1) of the Act, 21 U.S.C. section 360bbb-3(b)(1), unless the authorization is terminated or revoked.  Performed at [facility], [address], [place], [place] [address]   Surgical pcr screen     Status: None   Collection Time:  03/14/21  2:41 PM   Specimen: Nasal Mucosa; Nasal Swab  Result Value Ref Range Status   MRSA, PCR NEGATIVE NEGATIVE Final   Staphylococcus aureus NEGATIVE NEGATIVE Final    Comment: (NOTE) The Xpert SA  Assay (FDA approved for NASAL specimens in patients 4 years of age and older), is one component of a comprehensive surveillance program. It is not intended to diagnose infection nor to guide or monitor treatment. Performed at [facility], [address], [place], [place] [address]       Studies: No results found.    Joycelyn Das, MD  [facility] 03/19/2021  If 7PM-7AM, please contact night-coverage

## 2021-03-19 NOTE — TOC Transition Note (Signed)
Transition of Care [facility]) - CM/SW Discharge Note   Patient Details  Name: Latoya Perry MRN: [id number] Date of Birth: 09-30-1933  Transition of Care [facility]) CM/SW Contact:  Villa Herb, LCSWA Phone Number: 03/19/2021, 4:48 PM   Clinical Narrative:    CSW spoke with pts sister who states that they are agreeable to SNF. CSW asked that COVID test be completed. COVID resulted negative. Pts sister accepts bed at [facility] in [place], [place]. Discharge summary, covid results, fl2, snf transfer summary all faxed to facility. Pts sister updated of transfer to the facility. [facility] set pt up with ride with [employer]. CSW updated RN and provided number for report. Rider waiver verbally signed and sent emailed to [facility].TOC signing off.    Final next level of care: Skilled Nursing Facility Barriers to Discharge: Barriers Resolved   Patient Goals and CMS Choice Patient states their goals for this hospitalization and ongoing recovery are:: Go to SNF CMS Medicare.gov Compare Post Acute Care list provided to:: Patient Represenative (must comment) Choice offered to / list presented to : Sibling  Discharge Placement              Patient chooses bed at: Other - please specify in the comment section below: [facility]) Patient to be transferred to facility by: [facility] Name of family member notified: Ms. Clovis Riley (sister) Patient and family notified of of transfer: 03/19/21  Discharge Plan and Services                                     Social Determinants of Health (SDOH) Interventions     Readmission Risk Interventions No flowsheet data found.

## 2021-03-19 NOTE — Plan of Care (Signed)
  Problem: Acute Rehab OT Goals (only OT should resolve) Goal: Pt. Will Perform Grooming Flowsheets (Taken 03/19/2021 1120) Pt Will Perform Grooming:  with mod assist  with min assist  sitting  with adaptive equipment Goal: Pt. Will Perform Upper Body Dressing Flowsheets (Taken 03/19/2021 1120) Pt Will Perform Upper Body Dressing:  with min assist  with supervision  sitting Goal: Pt. Will Perform Lower Body Dressing Flowsheets (Taken 03/19/2021 1120) Pt Will Perform Lower Body Dressing:  with mod assist  sitting/lateral leans  with adaptive equipment Goal: Pt. Will Transfer To Toilet Flowsheets (Taken 03/19/2021 1120) Pt Will Transfer to Toilet:  with min assist  with mod assist  stand pivot transfer Goal: Pt/Caregiver Will Perform Home Exercise Program Flowsheets (Taken 03/19/2021 1120) Pt/caregiver will Perform Home Exercise Program:  Increased ROM  Both right and left upper extremity  With minimal assist  Jaylianna Tatlock OT, MOT

## 2021-03-19 NOTE — Plan of Care (Signed)
  Problem: Acute Rehab PT Goals(only PT should resolve) Goal: Pt Will Go Sit To Supine/Side Outcome: Progressing Flowsheets (Taken 03/19/2021 1054) Pt will go Sit to Supine/Side: with moderate assist Goal: Patient Will Transfer Sit To/From Stand Outcome: Progressing Flowsheets (Taken 03/19/2021 1054) Patient will transfer sit to/from stand: with moderate assist Goal: Pt Will Transfer Bed To Chair/Chair To Bed Outcome: Progressing Flowsheets (Taken 03/19/2021 1054) Pt will Transfer Bed to Chair/Chair to Bed: with mod assist Goal: Pt Will Ambulate Outcome: Progressing Flowsheets (Taken 03/19/2021 1054) Pt will Ambulate:  15 feet  with moderate assist  with rolling walker   10:55 AM, 03/19/21 Ocie Bob, MPT Physical Therapist with [facility] [phone] office [phone] mobile phone

## 2021-03-19 NOTE — Evaluation (Signed)
Occupational Therapy Evaluation Patient Details Name: Latoya Perry MRN: [id number] DOB: October 04, 1933 Today's Date: 03/19/2021    History of Present Illness Latoya Perry is a 87 y.o. female s/p Right intertrochanteric femur fracture on 03/15/21 with medical history significant for dementia.  Patient was brought to the ED via EMS reports of a fall, patient was walking down 2 steps and fell on her right hip.  History is obtained from chart review, patient has significant dementia limiting history.  Patient has a sitter that sits with her during the day.  Patient is normally able to walk with assistance with a gait belt.  I talked to patient's sister [name], patient fell because she missed the second step while coming down the stairs.  Patient's dementia is advanced and she is not able to communicate, assistance with all ADLs.   Clinical Impression   Pt agreeable to OT/PT co-evaluation. Pt agitated and required max A +2 for stand pivot transfer with RW. Pt level of agitation made formal assessment of B UE difficulty but pt observed to have shoulder A/ROM limitations and weakness. Pt required mod to max A for bed mobility as well. Pt will benefit from continued Ot in the hospital and recommended venue below to increase strength, balance, and endurance for asfe ADL's.     Follow Up Recommendations  SNF    Equipment Recommendations  None recommended by OT           Precautions / Restrictions Precautions Precautions: Fall Restrictions Weight Bearing Restrictions: Yes RLE Weight Bearing: Weight bearing as tolerated      Mobility Bed Mobility Overal bed mobility: Needs Assistance Bed Mobility: Sit to Supine     Supine to sit: Max assist;Mod assist Sit to supine: Mod assist;Max assist;HOB elevated   General bed mobility comments: agitated, labored movement, resistant to mobility.    Transfers Overall transfer level: Needs assistance Equipment used: Rolling walker (2  wheeled) Transfers: Sit to/from [employer] Sit to Stand: Mod assist;Max assist Stand pivot transfers: Max assist;+2 physical assistance       General transfer comment: very unsteady on feet and requires repeated verbal/tactile cueing to follow directions    Balance Overall balance assessment: Needs assistance Sitting-balance support: Feet supported;No upper extremity supported Sitting balance-Leahy Scale: Fair Sitting balance - Comments: seated at EOB   Standing balance support: During functional activity;Bilateral upper extremity supported Standing balance-Leahy Scale: Poor Standing balance comment: using RW                           ADL either performed or assessed with clinical judgement   ADL Overall ADL's : Needs assistance/impaired                         Toilet Transfer: Maximal assistance;RW;Stand-pivot Toilet Transfer Details (indicate cue type and reason): simulated via EOB to chair transfer                 Vision Baseline Vision/History: Glaucoma Patient Visual Report: No change from baseline                  Pertinent Vitals/Pain Pain Assessment: Faces Faces Pain Scale: Hurts even more Pain Location: right hip with movement Pain Descriptors / Indicators: Grimacing;Guarding;Sore Pain Intervention(s): Limited activity within patient's tolerance;Repositioned;Monitored during session;RN gave pain meds during session     Hand Dominance     Extremity/Trunk Assessment Upper Extremity Assessment Upper Extremity Assessment: RUE deficits/detail;LUE  deficits/detail;Generalized weakness RUE Deficits / Details: A/ROM observed at ~90* for shoulder flexion. Functionally able to reach out and place hand on RW. Dificult to fully assess due to pt cognitive status. RUE Coordination: decreased fine motor LUE Deficits / Details: ~160* A/ROM for shoulder flexion observed.  Functionally able to reach out and place hand on RW.  Dificult to fully assess due to pt cognitive status. LUE Coordination: decreased fine motor   Lower Extremity Assessment Lower Extremity Assessment: Defer to PT evaluation   Cervical / Trunk Assessment Cervical / Trunk Assessment: Normal   Communication Communication Communication: No difficulties   Cognition Arousal/Alertness: Awake/alert Behavior During Therapy: Agitated;Anxious Overall Cognitive Status: History of cognitive impairments - at baseline                                                      Home Living Family/patient expects to be discharged to:: Private residence Living Arrangements: Other relatives Available Help at Discharge: Family;Available 24 hours/day Type of Home: House Home Access: Stairs to enter [employer] of Steps: 2 Entrance Stairs-Rails: Right;Left;Can reach both Home Layout: Two level;Laundry or work area in Artist of Steps: Patient does not go to basement   [employer] Shower/Tub: Chief Strategy Officer: Handicapped height Bathroom Accessibility: Yes   Home Equipment: Environmental consultant - 2 wheels;Shower seat;Bedside commode;Cane - single point          Prior Functioning/Environment Level of Independence: Needs assistance  Gait / Transfers Assistance Needed: assisted transfers and very short household distances using RW ADL's / Homemaking Assistance Needed: assisted by family            OT Problem List: Decreased strength;Decreased range of motion;Decreased activity tolerance;Impaired balance (sitting and/or standing);Decreased cognition;Decreased safety awareness;Decreased knowledge of use of DME or AE;Decreased knowledge of precautions      OT Treatment/Interventions: Self-care/ADL training;Therapeutic exercise;DME and/or AE instruction;Therapeutic activities;Cognitive remediation/compensation;Patient/family education;Balance training    OT Goals(Current goals can be  found in the care plan section) Acute Rehab OT Goals Patient Stated Goal: Sister reported wanting pt to go to a facility. OT Goal Formulation: With family Time For Goal Achievement: 04/02/21 Potential to Achieve Goals: Fair  OT Frequency: Min 2X/week               Co-evaluation PT/OT/SLP Co-Evaluation/Treatment: Yes Reason for Co-Treatment: Necessary to address cognition/behavior during functional activity PT goals addressed during session: Mobility/safety with mobility;Proper use of DME OT goals addressed during session: ADL's and self-care;Strengthening/ROM                       End of Session Equipment Utilized During Treatment: Rolling walker  Activity Tolerance: Treatment limited secondary to agitation Patient left: in chair;with call bell/phone within reach;with chair alarm set;with family/visitor present  OT Visit Diagnosis: Unsteadiness on feet (R26.81);Muscle weakness (generalized) (M62.81);History of falling (Z91.81);Pain Pain - Right/Left: Right Pain - part of body: Hip                Time: 0258-5277 OT Time Calculation (min): 24 min Charges:  OT General Charges $OT Visit: 1 Visit OT Evaluation $OT Eval Moderate Complexity: 1 Mod  Keren Alverio OT, MOT  [employer] 03/19/2021, 11:16 AM

## 2021-03-22 LAB — BPAM RBC
Blood Product Expiration Date: 202208302359
Blood Product Expiration Date: 202208302359
ISSUE DATE / TIME: 202208011358
Unit Type and Rh: 5100
Unit Type and Rh: 5100

## 2021-03-22 LAB — TYPE AND SCREEN
ABO/RH(D): O POS
Antibody Screen: NEGATIVE
Unit division: 0
Unit division: 0

## 2021-04-03 ENCOUNTER — Telehealth: Payer: Self-pay | Admitting: Orthopedic Surgery

## 2021-04-03 NOTE — Telephone Encounter (Signed)
Done

## 2021-04-03 NOTE — Telephone Encounter (Signed)
Voice message received from [facility], per [facility], ph # [phone], asking about needing to remove staples and scheduling. Please review and advise.

## 2021-04-03 NOTE — Telephone Encounter (Signed)
Called and verbal order given for staple removal, and f/u appointment booked.

## 2021-04-23 ENCOUNTER — Telehealth: Payer: Self-pay | Admitting: Orthopedic Surgery

## 2021-04-23 NOTE — Telephone Encounter (Signed)
Pt's sister Lucia Estelle called and left a voicemail stating to cancel the appointment for the 7th.  I did this per her request.  I then got a call from [facility] [phone] from [facility] facility.  She said that pt's sister [name] wanted to cancel this appointment and I told her that I had already received this message.   I told her that Ms. Gadway would need to follow up for an appointment with Dr. Dallas Schimke.  She said she would speak to [facility] at the facility.  I spoke back to pt's sister [name] and said she that the facility does provide transportation to office visits but they were so expensive she couldn't afford to pay for that.  Lucia Estelle is going to speak to [place] at the facility and get Ms. Cobb referred to someone in [place] where the transportation is less.  I told her to let us know where she is referred to.  She said she would do this

## 2021-04-24 ENCOUNTER — Encounter: Payer: Federal, State, Local not specified - PPO | Admitting: Orthopedic Surgery

## 2021-04-26 NOTE — Telephone Encounter (Signed)
Left VM to call back to discuss information from Dr. Dallas Schimke.

## 2021-10-16 DEATH — deceased

## 2022-11-18 IMAGING — DX DG FEMUR 2+V PORT*R*
5 series · 5 of 5 positions shown · non-contrast
Comparison: None.

CLINICAL DATA: Postop right femur, ORIF

EXAM:
RIGHT FEMUR PORTABLE 2 VIEW

[femur ap proximal (1 of 2)]
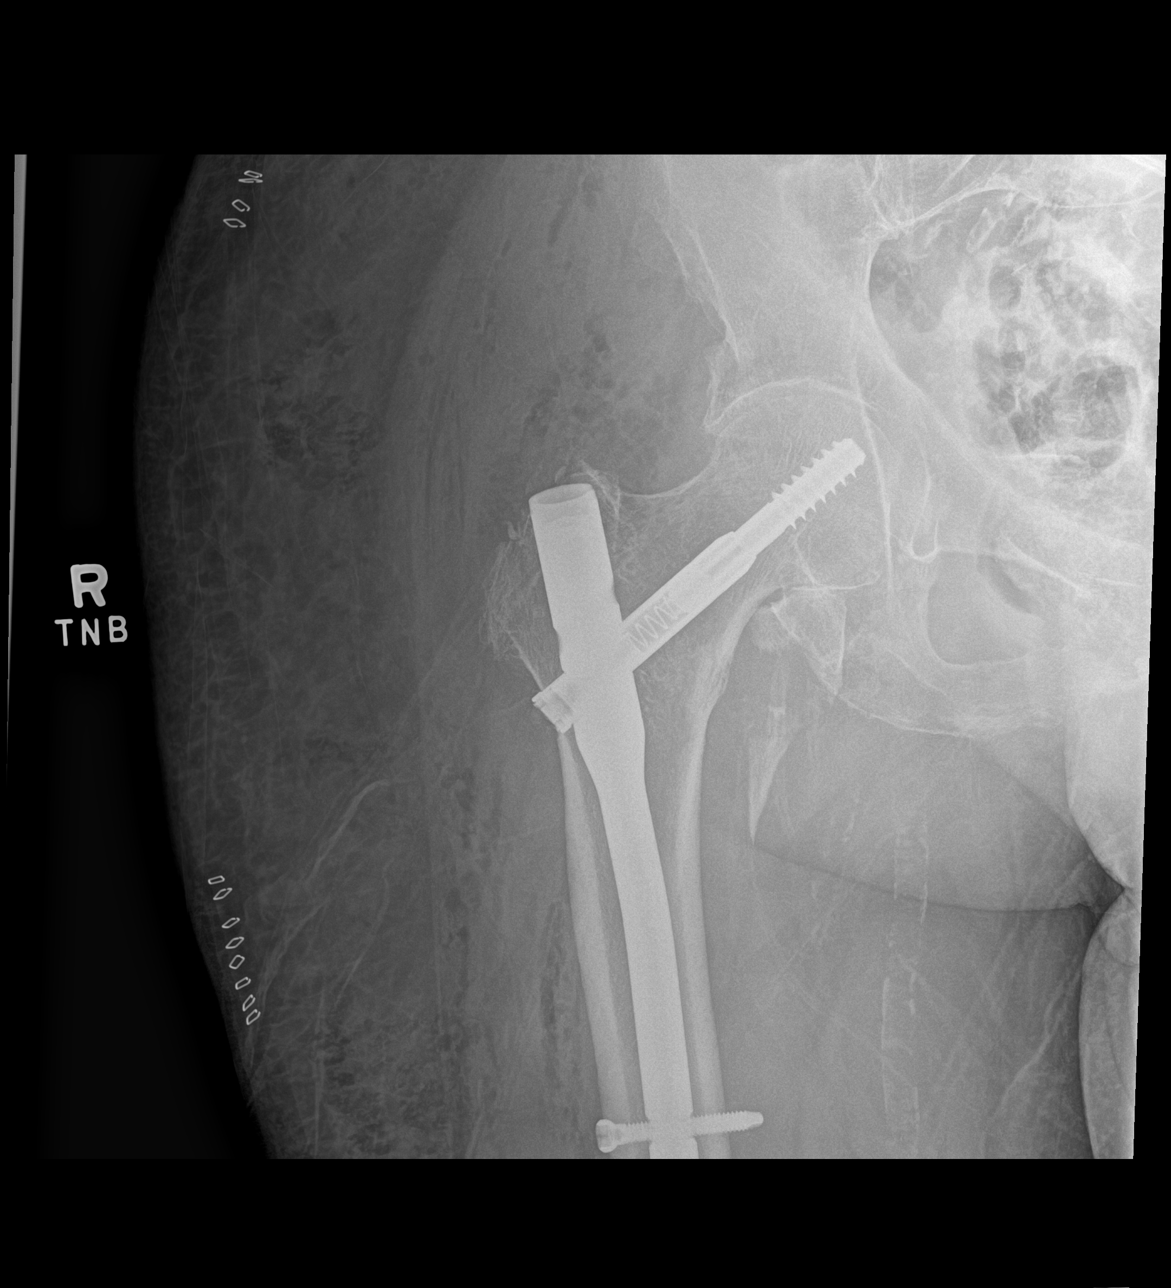

[femur ap proximal (2 of 2)]
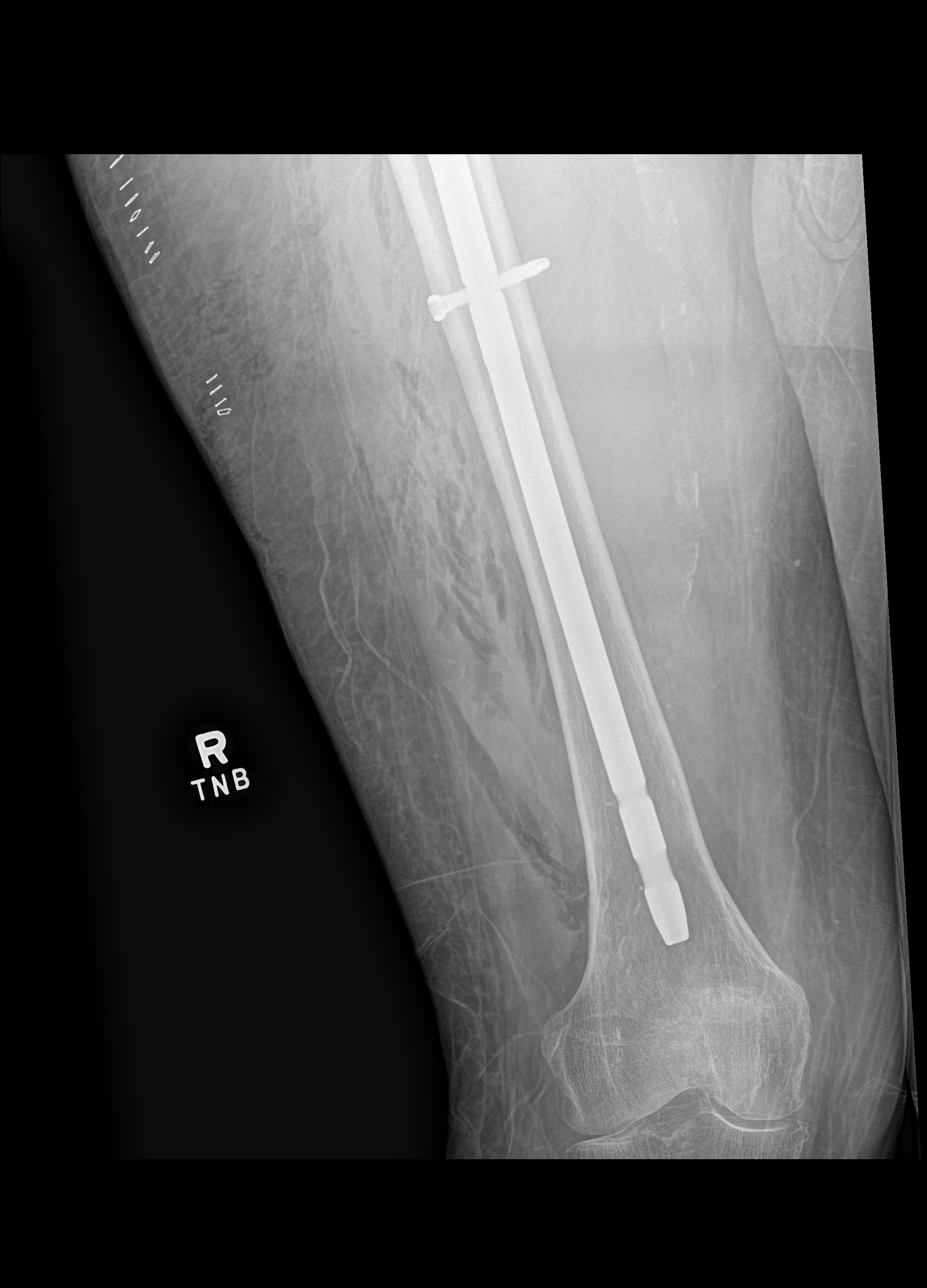

[femur lat distal (1 of 3)]
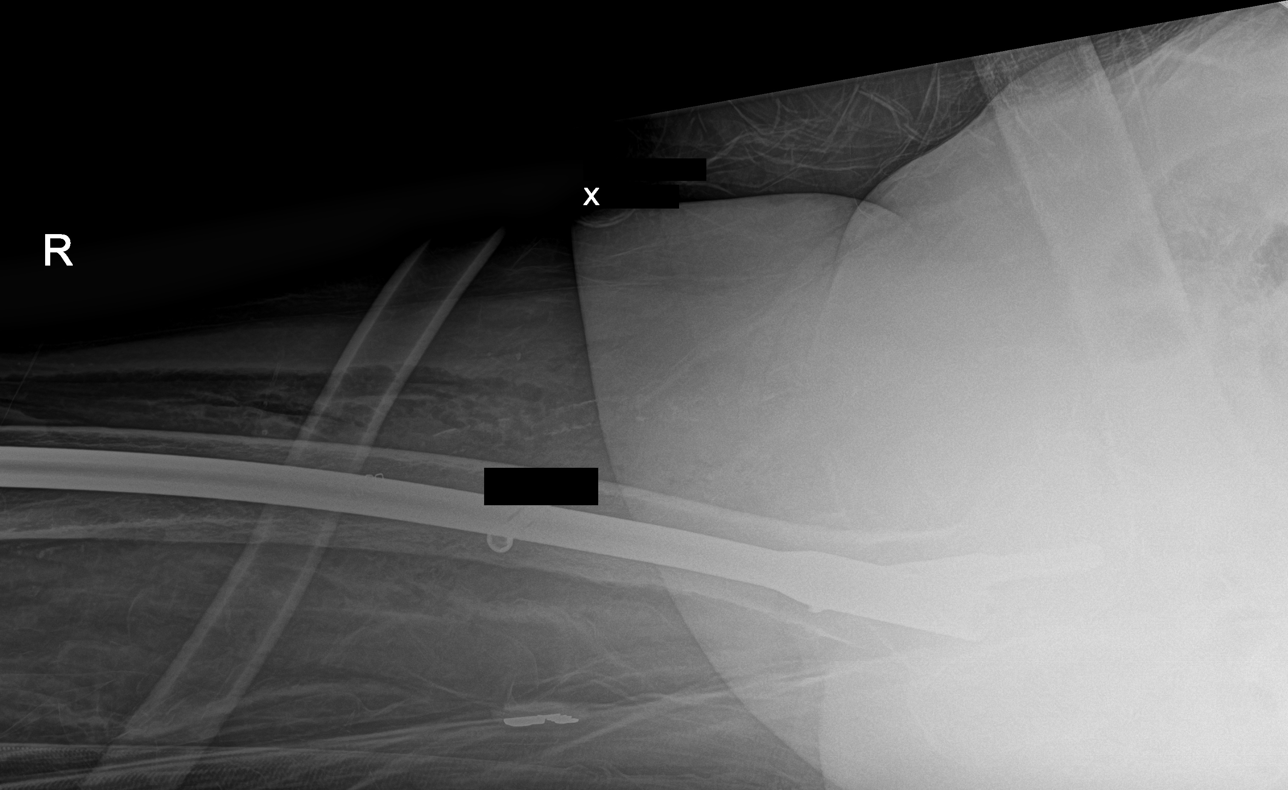

[femur lat distal (2 of 3)]
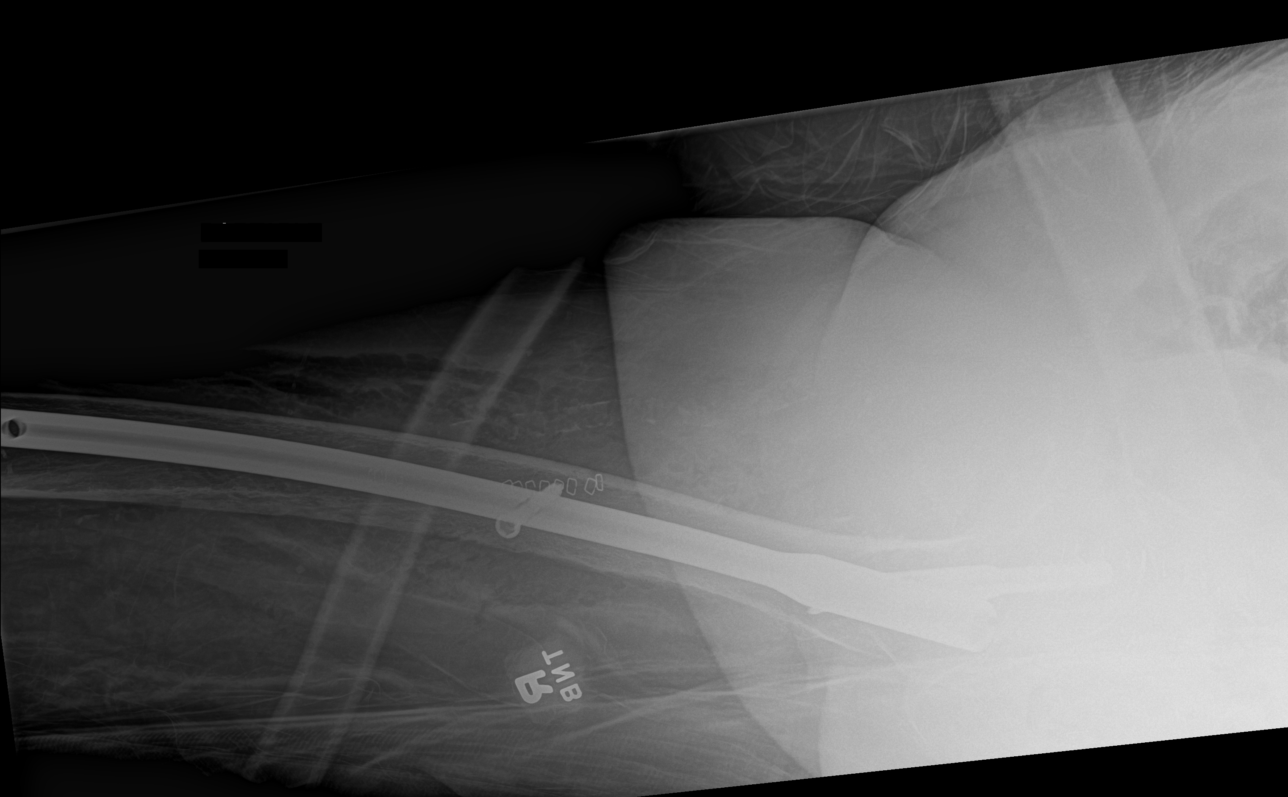

[femur lat distal (3 of 3)]
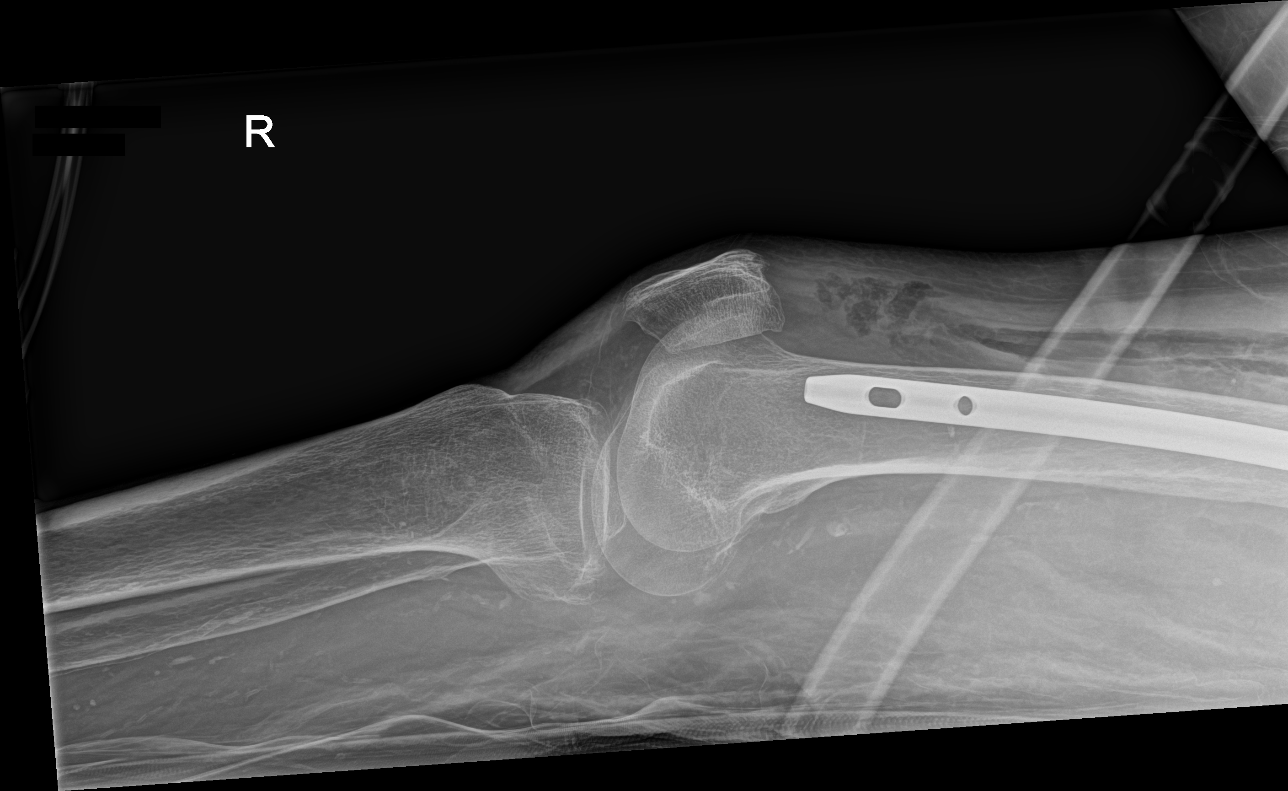

[5 of 5 positions shown; findings below may reference images not displayed]

FINDINGS: Internal fixation across the right femoral intertrochanteric
fracture. Mildly displaced lesser trochanter. Otherwise anatomic
alignment. No hardware or bony complicating feature.
IMPRESSION: Internal fixation as above.  No complicating feature.
